# Patient Record
Sex: Female | Born: 1940 | Race: White | Hispanic: No | Marital: Married | Smoking: Never smoker
Health system: Southern US, Community
[De-identification: ages and names within clinical notes are randomized; demographics above are authoritative.]

## PROBLEM LIST (undated history)

## (undated) DIAGNOSIS — M519 Unspecified thoracic, thoracolumbar and lumbosacral intervertebral disc disorder: Secondary | ICD-10-CM

## (undated) DIAGNOSIS — N959 Unspecified menopausal and perimenopausal disorder: Secondary | ICD-10-CM

## (undated) DIAGNOSIS — G43909 Migraine, unspecified, not intractable, without status migrainosus: Secondary | ICD-10-CM

## (undated) DIAGNOSIS — I493 Ventricular premature depolarization: Secondary | ICD-10-CM

## (undated) DIAGNOSIS — E559 Vitamin D deficiency, unspecified: Secondary | ICD-10-CM

## (undated) DIAGNOSIS — Z974 Presence of external hearing-aid: Secondary | ICD-10-CM

## (undated) DIAGNOSIS — M792 Neuralgia and neuritis, unspecified: Secondary | ICD-10-CM

## (undated) DIAGNOSIS — Z8719 Personal history of other diseases of the digestive system: Secondary | ICD-10-CM

## (undated) DIAGNOSIS — R51 Headache: Secondary | ICD-10-CM

## (undated) DIAGNOSIS — F329 Major depressive disorder, single episode, unspecified: Secondary | ICD-10-CM

## (undated) DIAGNOSIS — G459 Transient cerebral ischemic attack, unspecified: Secondary | ICD-10-CM

## (undated) DIAGNOSIS — D649 Anemia, unspecified: Secondary | ICD-10-CM

## (undated) DIAGNOSIS — M542 Cervicalgia: Secondary | ICD-10-CM

## (undated) DIAGNOSIS — H9190 Unspecified hearing loss, unspecified ear: Secondary | ICD-10-CM

## (undated) DIAGNOSIS — E785 Hyperlipidemia, unspecified: Secondary | ICD-10-CM

## (undated) DIAGNOSIS — I10 Essential (primary) hypertension: Secondary | ICD-10-CM

## (undated) DIAGNOSIS — I499 Cardiac arrhythmia, unspecified: Secondary | ICD-10-CM

## (undated) DIAGNOSIS — F419 Anxiety disorder, unspecified: Secondary | ICD-10-CM

## (undated) DIAGNOSIS — F32A Depression, unspecified: Secondary | ICD-10-CM

## (undated) DIAGNOSIS — K219 Gastro-esophageal reflux disease without esophagitis: Secondary | ICD-10-CM

## (undated) DIAGNOSIS — L719 Rosacea, unspecified: Secondary | ICD-10-CM

## (undated) DIAGNOSIS — I639 Cerebral infarction, unspecified: Secondary | ICD-10-CM

## (undated) DIAGNOSIS — R519 Headache, unspecified: Secondary | ICD-10-CM

## (undated) HISTORY — PX: TONSILLECTOMY: SUR1361

## (undated) HISTORY — PX: BUNIONECTOMY: SHX129

## (undated) HISTORY — PX: ABDOMINAL HYSTERECTOMY: SHX81

## (undated) HISTORY — PX: CHOLECYSTECTOMY: SHX55

## (undated) HISTORY — PX: COLONOSCOPY, ESOPHAGOGASTRODUODENOSCOPY (EGD) AND ESOPHAGEAL DILATION: SHX5781

---

## 2004-09-18 ENCOUNTER — Ambulatory Visit: Payer: Self-pay | Admitting: Internal Medicine

## 2005-12-24 ENCOUNTER — Ambulatory Visit: Payer: Self-pay | Admitting: Internal Medicine

## 2006-01-13 ENCOUNTER — Ambulatory Visit: Payer: Self-pay | Admitting: Internal Medicine

## 2007-01-17 ENCOUNTER — Ambulatory Visit: Payer: Self-pay | Admitting: Internal Medicine

## 2007-09-09 ENCOUNTER — Ambulatory Visit: Payer: Self-pay | Admitting: Internal Medicine

## 2007-09-16 ENCOUNTER — Ambulatory Visit: Payer: Self-pay | Admitting: Surgery

## 2008-01-26 ENCOUNTER — Ambulatory Visit: Payer: Self-pay | Admitting: Internal Medicine

## 2009-02-06 ENCOUNTER — Ambulatory Visit: Payer: Self-pay | Admitting: Internal Medicine

## 2009-07-23 ENCOUNTER — Ambulatory Visit: Payer: Self-pay | Admitting: Internal Medicine

## 2010-02-11 ENCOUNTER — Ambulatory Visit: Payer: Self-pay | Admitting: Internal Medicine

## 2010-06-23 ENCOUNTER — Ambulatory Visit: Payer: Self-pay | Admitting: Unknown Physician Specialty

## 2010-06-25 LAB — PATHOLOGY REPORT

## 2010-08-20 ENCOUNTER — Ambulatory Visit: Payer: Self-pay | Admitting: Unknown Physician Specialty

## 2011-02-17 ENCOUNTER — Other Ambulatory Visit: Payer: Self-pay | Admitting: Specialist

## 2011-02-17 DIAGNOSIS — R51 Headache: Secondary | ICD-10-CM

## 2011-02-24 ENCOUNTER — Ambulatory Visit
Admission: RE | Admit: 2011-02-24 | Discharge: 2011-02-24 | Disposition: A | Discharge status: Home / Self Care | Payer: Medicare PPO | Source: Ambulatory Visit | Attending: Specialist | Admitting: Specialist

## 2011-02-24 DIAGNOSIS — R51 Headache: Secondary | ICD-10-CM

## 2011-03-05 ENCOUNTER — Ambulatory Visit: Payer: Self-pay | Admitting: Internal Medicine

## 2011-10-01 ENCOUNTER — Ambulatory Visit: Payer: Self-pay | Admitting: Unknown Physician Specialty

## 2012-03-14 ENCOUNTER — Ambulatory Visit: Payer: Self-pay | Admitting: Internal Medicine

## 2013-03-24 ENCOUNTER — Ambulatory Visit: Payer: Self-pay | Admitting: Internal Medicine

## 2014-01-19 ENCOUNTER — Ambulatory Visit: Payer: Self-pay | Admitting: Podiatry

## 2014-01-23 ENCOUNTER — Encounter: Payer: Self-pay | Admitting: Podiatry

## 2014-01-23 ENCOUNTER — Ambulatory Visit (INDEPENDENT_AMBULATORY_CARE_PROVIDER_SITE_OTHER): Payer: Medicare PPO | Admitting: Podiatry

## 2014-01-23 ENCOUNTER — Ambulatory Visit (INDEPENDENT_AMBULATORY_CARE_PROVIDER_SITE_OTHER): Payer: Medicare PPO

## 2014-01-23 VITALS — BP 114/70 | HR 67 | Resp 12

## 2014-01-23 DIAGNOSIS — L84 Corns and callosities: Secondary | ICD-10-CM

## 2014-01-23 DIAGNOSIS — R52 Pain, unspecified: Secondary | ICD-10-CM

## 2014-01-23 DIAGNOSIS — M2010 Hallux valgus (acquired), unspecified foot: Secondary | ICD-10-CM

## 2014-01-23 NOTE — Progress Notes (Signed)
   Subjective:    Patient ID: Brittany Phillips, female    DOB: [birth year], 73 y.o.   MRN: [id number]  HPI PT STATED RT FOOT START HURTING 2-3 MONTHS. THE FOOT IS BEEN THE SAME AND IS NOT GETTING WORSE. THE FOOT GET AGGRAVATED BY PUTTING PRESSURE AND WALKING, TRIED NO TREATMENT.    Review of Systems  HENT: Positive for hearing loss and sinus pressure.   Eyes: Positive for itching.  Genitourinary: Positive for frequency.  Allergic/Immunologic: Positive for food allergies.  Neurological: Positive for tremors and headaches.  Psychiatric/Behavioral: The patient is nervous/anxious.        Objective:   Physical Exam        Assessment & Plan:

## 2014-01-23 NOTE — Progress Notes (Signed)
Subjective:     Patient ID: Brittany Phillips, female   DOB: 1941-03-02, 73 y.o.   MRN: [id number]  Foot Pain   patient presents stating my right foot has started to bother me more for the last few months and I don't remember specifically what I have done. It does not her a lot but he gets aggravated   Review of Systems  All other systems reviewed and are negative.       Objective:   Physical Exam  Nursing note and vitals reviewed. Constitutional: She is oriented to person, place, and time.  Cardiovascular: Intact distal pulses.   Musculoskeletal: Normal range of motion.  Neurological: She is oriented to person, place, and time.  Skin: Skin is warm.   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

## 2014-06-01 DIAGNOSIS — K219 Gastro-esophageal reflux disease without esophagitis: Secondary | ICD-10-CM | POA: Insufficient documentation

## 2014-06-01 DIAGNOSIS — M519 Unspecified thoracic, thoracolumbar and lumbosacral intervertebral disc disorder: Secondary | ICD-10-CM | POA: Insufficient documentation

## 2014-06-01 DIAGNOSIS — I493 Ventricular premature depolarization: Secondary | ICD-10-CM | POA: Insufficient documentation

## 2014-07-04 ENCOUNTER — Ambulatory Visit: Payer: Self-pay | Admitting: Internal Medicine

## 2014-07-06 ENCOUNTER — Ambulatory Visit: Payer: Self-pay | Admitting: Internal Medicine

## 2015-06-24 ENCOUNTER — Other Ambulatory Visit: Payer: Self-pay | Admitting: Internal Medicine

## 2015-06-24 DIAGNOSIS — Z1231 Encounter for screening mammogram for malignant neoplasm of breast: Secondary | ICD-10-CM

## 2015-06-26 ENCOUNTER — Encounter: Payer: Self-pay | Admitting: *Deleted

## 2015-06-27 ENCOUNTER — Encounter
Admission: RE | Disposition: A | Discharge status: Home / Self Care | Payer: Self-pay | Source: Ambulatory Visit | Attending: Unknown Physician Specialty

## 2015-06-27 ENCOUNTER — Ambulatory Visit
Admission: RE | Admit: 2015-06-27 | Discharge: 2015-06-27 | Disposition: A | Discharge status: Home / Self Care | Payer: Medicare PPO | Source: Ambulatory Visit | Attending: Unknown Physician Specialty | Admitting: Unknown Physician Specialty

## 2015-06-27 ENCOUNTER — Encounter: Payer: Self-pay | Admitting: *Deleted

## 2015-06-27 ENCOUNTER — Ambulatory Visit: Payer: Medicare PPO | Admitting: Anesthesiology

## 2015-06-27 DIAGNOSIS — R1310 Dysphagia, unspecified: Secondary | ICD-10-CM | POA: Diagnosis not present

## 2015-06-27 DIAGNOSIS — M792 Neuralgia and neuritis, unspecified: Secondary | ICD-10-CM | POA: Diagnosis not present

## 2015-06-27 DIAGNOSIS — Z90710 Acquired absence of both cervix and uterus: Secondary | ICD-10-CM | POA: Insufficient documentation

## 2015-06-27 DIAGNOSIS — M519 Unspecified thoracic, thoracolumbar and lumbosacral intervertebral disc disorder: Secondary | ICD-10-CM | POA: Insufficient documentation

## 2015-06-27 DIAGNOSIS — Z800 Family history of malignant neoplasm of digestive organs: Secondary | ICD-10-CM | POA: Diagnosis present

## 2015-06-27 DIAGNOSIS — K640 First degree hemorrhoids: Secondary | ICD-10-CM | POA: Diagnosis not present

## 2015-06-27 DIAGNOSIS — Z1211 Encounter for screening for malignant neoplasm of colon: Secondary | ICD-10-CM | POA: Diagnosis not present

## 2015-06-27 DIAGNOSIS — E559 Vitamin D deficiency, unspecified: Secondary | ICD-10-CM | POA: Insufficient documentation

## 2015-06-27 DIAGNOSIS — Z7982 Long term (current) use of aspirin: Secondary | ICD-10-CM | POA: Diagnosis not present

## 2015-06-27 DIAGNOSIS — Z79899 Other long term (current) drug therapy: Secondary | ICD-10-CM | POA: Insufficient documentation

## 2015-06-27 DIAGNOSIS — K219 Gastro-esophageal reflux disease without esophagitis: Secondary | ICD-10-CM | POA: Diagnosis present

## 2015-06-27 DIAGNOSIS — E785 Hyperlipidemia, unspecified: Secondary | ICD-10-CM | POA: Diagnosis not present

## 2015-06-27 DIAGNOSIS — Z885 Allergy status to narcotic agent status: Secondary | ICD-10-CM | POA: Diagnosis not present

## 2015-06-27 DIAGNOSIS — Z9889 Other specified postprocedural states: Secondary | ICD-10-CM | POA: Diagnosis not present

## 2015-06-27 DIAGNOSIS — R109 Unspecified abdominal pain: Secondary | ICD-10-CM | POA: Diagnosis not present

## 2015-06-27 DIAGNOSIS — R12 Heartburn: Secondary | ICD-10-CM | POA: Insufficient documentation

## 2015-06-27 DIAGNOSIS — Z7951 Long term (current) use of inhaled steroids: Secondary | ICD-10-CM | POA: Diagnosis not present

## 2015-06-27 HISTORY — DX: Vitamin D deficiency, unspecified: E55.9

## 2015-06-27 HISTORY — DX: Headache, unspecified: R51.9

## 2015-06-27 HISTORY — DX: Unspecified thoracic, thoracolumbar and lumbosacral intervertebral disc disorder: M51.9

## 2015-06-27 HISTORY — DX: Neuralgia and neuritis, unspecified: M79.2

## 2015-06-27 HISTORY — PX: ESOPHAGOGASTRODUODENOSCOPY (EGD) WITH PROPOFOL: SHX5813

## 2015-06-27 HISTORY — PX: COLONOSCOPY WITH PROPOFOL: SHX5780

## 2015-06-27 HISTORY — DX: Hyperlipidemia, unspecified: E78.5

## 2015-06-27 HISTORY — DX: Gastro-esophageal reflux disease without esophagitis: K21.9

## 2015-06-27 HISTORY — PX: SAVORY DILATION: SHX5439

## 2015-06-27 HISTORY — DX: Unspecified menopausal and perimenopausal disorder: N95.9

## 2015-06-27 HISTORY — DX: Anemia, unspecified: D64.9

## 2015-06-27 HISTORY — DX: Headache: R51

## 2015-06-27 HISTORY — DX: Cardiac arrhythmia, unspecified: I49.9

## 2015-06-27 HISTORY — DX: Cervicalgia: M54.2

## 2015-06-27 SURGERY — COLONOSCOPY WITH PROPOFOL
Anesthesia: General

## 2015-06-27 MED ORDER — PROPOFOL INFUSION 10 MG/ML OPTIME
INTRAVENOUS | Status: DC | PRN
  Administered 2015-06-27: 120 ug/kg/min via INTRAVENOUS

## 2015-06-27 MED ORDER — SODIUM CHLORIDE 0.9 % IV SOLN: INTRAVENOUS | Status: DC

## 2015-06-27 MED ORDER — MIDAZOLAM HCL 2 MG/2ML IJ SOLN
INTRAMUSCULAR | Status: DC | PRN
  Administered 2015-06-27: 1 mg via INTRAVENOUS

## 2015-06-27 MED ORDER — SODIUM CHLORIDE 0.9 % IV SOLN
INTRAVENOUS | Status: DC
  Administered 2015-06-27: 11:00:00 via INTRAVENOUS

## 2015-06-27 MED ORDER — LIDOCAINE HCL (CARDIAC) 20 MG/ML IV SOLN
INTRAVENOUS | Status: DC | PRN
  Administered 2015-06-27: 40 mg via INTRAVENOUS

## 2015-06-27 MED ORDER — GLYCOPYRROLATE 0.2 MG/ML IJ SOLN
INTRAMUSCULAR | Status: DC | PRN
  Administered 2015-06-27: 0.1 mg via INTRAVENOUS

## 2015-06-27 MED ORDER — FENTANYL CITRATE (PF) 100 MCG/2ML IJ SOLN
INTRAMUSCULAR | Status: DC | PRN
  Administered 2015-06-27: 50 ug via INTRAVENOUS

## 2015-06-27 NOTE — Op Note (Signed)
[facility] Patient Name: Brittany Phillips Procedure Date: 06/27/2015 11:06 AM MRN: [id number] Account #: [id number] Date of Birth: 1941-03-24 Admit Type: Outpatient Age: 74 Room: [facility] ENDO ROOM 1 Gender: Female Note Status: Finalized Procedure:         Upper GI endoscopy Indications:       Dysphagia, Heartburn Providers:         Scot Jun, MD Referring MD:      Danella Penton, MD (Referring MD) Medicines:         Propofol per Anesthesia Complications:     No immediate complications. Procedure:         Pre-Anesthesia Assessment:                    - After reviewing the risks and benefits, the patient was                     deemed in satisfactory condition to undergo the procedure.                    After obtaining informed consent, the endoscope was passed                     under direct vision. Throughout the procedure, the                     patient's blood pressure, pulse, and oxygen saturations                     were monitored continuously. The Olympus GIF-160 endoscope                     (S#. [id number]) was introduced through the mouth, and                     advanced to the second part of duodenum. The upper GI                     endoscopy was accomplished without difficulty. The patient                     tolerated the procedure well. Findings:      The examined esophagus was normal. A guidewire was placed and the scope       was withdrawn. Dilation was performed with a Savary dilator with mild       resistance at 15 mm and 16 mm.      The entire examined stomach was normal.      The examined duodenum was normal. Impression:        - Normal esophagus. Dilated.                    - Normal stomach.                    - Normal examined duodenum.                    - No specimens collected. Recommendation:    - soft food for 3 days, eat slowly, chew well, take small                     bites Scot Jun, MD 06/27/2015  11:18:52 AM This report has been signed electronically. Number of Addenda: 0 Note Initiated On: 06/27/2015 11:06 AM  [facility]

## 2015-06-27 NOTE — H&P (Signed)
Primary Care Physician:  Danella Penton., MD Primary Gastroenterologist:  Dr. Mechele Collin  Pre-Procedure History & Physical: HPI:  Brittany Phillips is a 74 y.o. female is here for an colonoscopy.   Past Medical History  Diagnosis Date  . Cervicalgia   . GERD (gastroesophageal reflux disease)   . Neuralgia   . Hyperlipidemia   . Post menopausal problems   . Headache   . Anemia   . Lumbar disc disease   . Dysrhythmia   . Vitamin D deficiency     Past Surgical History  Procedure Laterality Date  . Tonsillectomy    . Abdominal hysterectomy    . Colonoscopy, esophagogastroduodenoscopy (egd) and esophageal dilation    . Bunionectomy      Prior to Admission medications   Medication Sig Start Date End Date Taking? Authorizing Provider  butalbital-acetaminophen-caffeine (FIORICET WITH CODEINE) 50-325-40-30 MG per capsule Take 1 capsule by mouth 2 (two) times daily.   Yes Historical Provider, MD  etodolac (LODINE) 400 MG tablet Take 400 mg by mouth 2 (two) times daily.   Yes Historical Provider, MD  pantoprazole (PROTONIX) 40 MG tablet Take 40 mg by mouth daily.   Yes Historical Provider, MD  propranolol (INDERAL) 40 MG tablet Take 40 mg by mouth 3 (three) times daily.   Yes Historical Provider, MD  sucralfate (CARAFATE) 1 G tablet Take 1 g by mouth 4 (four) times daily -  with meals and at bedtime.   Yes Historical Provider, MD  traMADol (ULTRAM) 50 MG tablet Take 50 mg by mouth every 6 (six) hours as needed for moderate pain (3x/day as needed).   Yes Historical Provider, MD  ALPRAZolam (NIRAVAM) 0.25 MG dissolvable tablet Take 0.25 mg by mouth at bedtime as needed for anxiety.    Historical Provider, MD  aspirin 81 MG tablet Take 81 mg by mouth daily.    Historical Provider, MD  cyclobenzaprine (FLEXERIL) 10 MG tablet Take 10 mg by mouth 3 (three) times daily as needed for muscle spasms.    Historical Provider, MD  estradiol (ESTRACE) 1 MG tablet Take 1 mg by mouth daily.    Historical  Provider, MD  fluticasone (VERAMYST) 27.5 MCG/SPRAY nasal spray Place 2 sprays into the nose daily.    Historical Provider, MD  omeprazole (PRILOSEC) 20 MG capsule Take 20 mg by mouth daily.    Historical Provider, MD  venlafaxine XR (EFFEXOR-XR) 37.5 MG 24 hr capsule Take 37.5 mg by mouth daily with breakfast.    Historical Provider, MD  VITAMIN D, CHOLECALCIFEROL, PO Take by mouth.    Historical Provider, MD    Allergies as of 05/16/2015 - Review Complete 01/23/2014  Allergen Reaction Noted  . Codeine  01/23/2014    History reviewed. No pertinent family history.  Social History   Social History  . Marital Status: Married    Spouse Name: N/A  . Number of Children: N/A  . Years of Education: N/A   Occupational History  . Not on file.   Social History Main Topics  . Smoking status: Never Smoker   . Smokeless tobacco: Never Used  . Alcohol Use: No  . Drug Use: No  . Sexual Activity: Not on file   Other Topics Concern  . Not on file   Social History Narrative    Review of Systems: See HPI, otherwise negative ROS  Physical Exam: BP 146/86 mmHg  Pulse 86  Temp(Src) 96.5 F (35.8 C) (Tympanic)  Resp 14  Ht  (1.575  m)  Wt 65.772 kg (145 lb)  BMI 26.51 kg/m2  SpO2 100% General:   Alert,  pleasant and cooperative in NAD Head:  Normocephalic and atraumatic. Neck:  Supple; no masses or thyromegaly. Lungs:  Clear throughout to auscultation.    Heart:  Regular rate and rhythm. Abdomen:  Soft, nontender and nondistended. Normal bowel sounds, without guarding, and without rebound.   Neurologic:  Alert and  oriented x4;  grossly normal neurologically.  Impression/Plan: Brittany Phillips is here for an colonoscopy to be performed for screening, family hx colon polyps  Risks, benefits, limitations, and alternatives regarding  colonoscopy have been reviewed with the patient.  Questions have been answered.  All parties agreeable.   Lynnae Prude, MD  06/27/2015, 1:11  PM  colonoscopy

## 2015-06-27 NOTE — Transfer of Care (Signed)
Immediate Anesthesia Transfer of Care Note  Patient: Brittany Phillips  Procedure(s) Performed: Procedure(s): COLONOSCOPY WITH PROPOFOL (N/A) ESOPHAGOGASTRODUODENOSCOPY (EGD) WITH PROPOFOL (N/A) SAVORY DILATION  Patient Location: PACU  Anesthesia Type:gen  Level of Consciousness: awake and sedated  Airway & Oxygen Therapy: Patient Spontanous Breathing and Patient connected to nasal cannula oxygen  Post-op Assessment: Report given to RN and Post -op Vital signs reviewed and stable  Post vital signs: Reviewed and stable  Last Vitals:  Filed Vitals:   06/27/15 1021  BP: 153/89  Pulse: 86  Temp: 36.6 C  Resp: 20    Complications: No apparent anesthesia complications

## 2015-06-27 NOTE — Op Note (Signed)
[facility] Patient Name: Brittany Phillips Procedure Date: 06/27/2015 11:05 AM MRN: [id number] Account #: [id number] Date of Birth: 1941-09-23 Admit Type: Outpatient Age: 74 Room: [facility] ENDO ROOM 1 Gender: Female Note Status: Finalized Procedure:         Colonoscopy Indications:       Screening in patient at increased risk: Family history of                     1st-degree relative with colorectal cancer Providers:         Scot Jun, MD Referring MD:      Danella Penton, MD (Referring MD) Medicines:         Propofol per Anesthesia Complications:     No immediate complications. Procedure:         Pre-Anesthesia Assessment:                    - After reviewing the risks and benefits, the patient was                     deemed in satisfactory condition to undergo the procedure.                    After obtaining informed consent, the colonoscope was                     passed under direct vision. Throughout the procedure, the                     patient's blood pressure, pulse, and oxygen saturations                     were monitored continuously. The Colonoscope was                     introduced through the anus and advanced to the the cecum,                     identified by appendiceal orifice and ileocecal valve. The                     colonoscopy was performed without difficulty. The patient                     tolerated the procedure well. The quality of the bowel                     preparation was excellent. Findings:      Internal hemorrhoids were found during endoscopy. The hemorrhoids were       small and Grade I (internal hemorrhoids that do not prolapse).      The exam was otherwise without abnormality. Impression:        - Internal hemorrhoids.                    - The examination was otherwise normal.                    - No specimens collected. Recommendation:    - Repeat colonoscopy in 5 years for screening purposes. Scot Jun, MD 06/27/2015 11:36:26 AM This report has been signed electronically. Number of Addenda: 0 Note Initiated On: 06/27/2015 11:05 AM Scope Withdrawal Time: 0 hours 6 minutes 1 second  Total Procedure Duration: 0 hours  13 minutes 3 seconds       [facility]

## 2015-06-27 NOTE — Anesthesia Postprocedure Evaluation (Signed)
  Anesthesia Post-op Note  Patient: Brittany Phillips  Procedure(s) Performed: Procedure(s): COLONOSCOPY WITH PROPOFOL (N/A) ESOPHAGOGASTRODUODENOSCOPY (EGD) WITH PROPOFOL (N/A) SAVORY DILATION  Anesthesia type:General  Patient location: PACU  Post pain: Pain level controlled  Post assessment: Post-op Vital signs reviewed, Patient's Cardiovascular Status Stable, Respiratory Function Stable, Patent Airway and No signs of Nausea or vomiting  Post vital signs: Reviewed and stable  Last Vitals:  Filed Vitals:   06/27/15 1210  BP: 146/86  Pulse: 86  Temp:   Resp: 14    Level of consciousness: awake, alert  and patient cooperative  Complications: No apparent anesthesia complications

## 2015-06-27 NOTE — Anesthesia Procedure Notes (Signed)
Performed by: COOK-MARTIN, Narcissa Melder Pre-anesthesia Checklist: Patient identified, Emergency Drugs available, Suction available, Patient being monitored and Timeout performed Patient Re-evaluated:Patient Re-evaluated prior to inductionOxygen Delivery Method: Nasal cannula Preoxygenation: Pre-oxygenation with 100% oxygen Intubation Type: IV induction Airway Equipment and Method: Bite block Placement Confirmation: CO2 detector and positive ETCO2     

## 2015-06-27 NOTE — H&P (Signed)
Primary Care Physician:  Danella Penton., MD Primary Gastroenterologist:  Dr. Mechele Collin  Pre-Procedure History & Physical: HPI:  Brittany Phillips is a 74 y.o. female is here for an endoscopy and colonoscopy.   Past Medical History  Diagnosis Date  . Cervicalgia   . GERD (gastroesophageal reflux disease)   . Neuralgia   . Hyperlipidemia   . Post menopausal problems   . Headache   . Anemia   . Lumbar disc disease   . Dysrhythmia   . Vitamin D deficiency     Past Surgical History  Procedure Laterality Date  . Tonsillectomy    . Abdominal hysterectomy    . Colonoscopy, esophagogastroduodenoscopy (egd) and esophageal dilation    . Bunionectomy      Prior to Admission medications   Medication Sig Start Date End Date Taking? Authorizing Provider  butalbital-acetaminophen-caffeine (FIORICET WITH CODEINE) 50-325-40-30 MG per capsule Take 1 capsule by mouth 2 (two) times daily.   Yes Historical Provider, MD  etodolac (LODINE) 400 MG tablet Take 400 mg by mouth 2 (two) times daily.   Yes Historical Provider, MD  pantoprazole (PROTONIX) 40 MG tablet Take 40 mg by mouth daily.   Yes Historical Provider, MD  propranolol (INDERAL) 40 MG tablet Take 40 mg by mouth 3 (three) times daily.   Yes Historical Provider, MD  sucralfate (CARAFATE) 1 G tablet Take 1 g by mouth 4 (four) times daily -  with meals and at bedtime.   Yes Historical Provider, MD  traMADol (ULTRAM) 50 MG tablet Take 50 mg by mouth every 6 (six) hours as needed for moderate pain (3x/day as needed).   Yes Historical Provider, MD  ALPRAZolam (NIRAVAM) 0.25 MG dissolvable tablet Take 0.25 mg by mouth at bedtime as needed for anxiety.    Historical Provider, MD  aspirin 81 MG tablet Take 81 mg by mouth daily.    Historical Provider, MD  cyclobenzaprine (FLEXERIL) 10 MG tablet Take 10 mg by mouth 3 (three) times daily as needed for muscle spasms.    Historical Provider, MD  estradiol (ESTRACE) 1 MG tablet Take 1 mg by mouth daily.     Historical Provider, MD  fluticasone (VERAMYST) 27.5 MCG/SPRAY nasal spray Place 2 sprays into the nose daily.    Historical Provider, MD  omeprazole (PRILOSEC) 20 MG capsule Take 20 mg by mouth daily.    Historical Provider, MD  venlafaxine XR (EFFEXOR-XR) 37.5 MG 24 hr capsule Take 37.5 mg by mouth daily with breakfast.    Historical Provider, MD  VITAMIN D, CHOLECALCIFEROL, PO Take by mouth.    Historical Provider, MD    Allergies as of 05/16/2015 - Review Complete 01/23/2014  Allergen Reaction Noted  . Codeine  01/23/2014    History reviewed. No pertinent family history.  Social History   Social History  . Marital Status: Married    Spouse Name: N/A  . Number of Children: N/A  . Years of Education: N/A   Occupational History  . Not on file.   Social History Main Topics  . Smoking status: Never Smoker   . Smokeless tobacco: Never Used  . Alcohol Use: No  . Drug Use: No  . Sexual Activity: Not on file   Other Topics Concern  . Not on file   Social History Narrative    Review of Systems: See HPI, otherwise negative ROS  Physical Exam: BP 153/89 mmHg  Pulse 86  Temp(Src) 97.8 F (36.6 C) (Tympanic)  Resp 20  Ht 5'  2" (1.575 m)  Wt 65.772 kg (145 lb)  BMI 26.51 kg/m2  SpO2 100% General:   Alert,  pleasant and cooperative in NAD Head:  Normocephalic and atraumatic. Neck:  Supple; no masses or thyromegaly. Lungs:  Clear throughout to auscultation.    Heart:  Regular rate and rhythm. Abdomen:  Soft, nontender and nondistended. Normal bowel sounds, without guarding, and without rebound.   Neurologic:  Alert and  oriented x4;  grossly normal neurologically.  Impression/Plan: Brittany Phillips is here for an endoscopy and colonoscopy to be performed for Family history of colon cancer and abdominal pain and heartburn and dysphagia.  Risks, benefits, limitations, and alternatives regarding  endoscopy and colonoscopy have been reviewed with the patient.  Questions  have been answered.  All parties agreeable.   Lynnae Prude, MD  06/27/2015, 11:04 AM

## 2015-06-27 NOTE — Anesthesia Preprocedure Evaluation (Signed)
Anesthesia Evaluation  Patient identified by MRN, date of birth, ID band Patient awake    Reviewed: Allergy & Precautions, NPO status , Patient's Chart, lab work & pertinent test results, reviewed documented beta blocker date and time   Airway Mallampati: II  TM Distance: >3 FB     Dental  (+) Chipped   Pulmonary          Cardiovascular + dysrhythmias     Neuro/Psych  Headaches,  Neuromuscular disease    GI/Hepatic GERD-  ,  Endo/Other    Renal/GU      Musculoskeletal   Abdominal   Peds  Hematology  (+) anemia ,   Anesthesia Other Findings   Reproductive/Obstetrics                             Anesthesia Physical Anesthesia Plan  ASA: III  Anesthesia Plan: General   Post-op Pain Management:    Induction:   Airway Management Planned: Nasal Cannula  Additional Equipment:   Intra-op Plan:   Post-operative Plan:   Informed Consent: I have reviewed the patients History and Physical, chart, labs and discussed the procedure including the risks, benefits and alternatives for the proposed anesthesia with the patient or authorized representative who has indicated his/her understanding and acceptance.     Plan Discussed with: CRNA  Anesthesia Plan Comments:         Anesthesia Quick Evaluation

## 2015-06-28 ENCOUNTER — Encounter: Payer: Self-pay | Admitting: Unknown Physician Specialty

## 2015-07-08 ENCOUNTER — Ambulatory Visit
Admission: RE | Admit: 2015-07-08 | Discharge: 2015-07-08 | Disposition: A | Discharge status: Home / Self Care | Payer: Medicare PPO | Source: Ambulatory Visit | Attending: Internal Medicine | Admitting: Internal Medicine

## 2015-07-08 ENCOUNTER — Other Ambulatory Visit: Payer: Self-pay | Admitting: Internal Medicine

## 2015-07-08 DIAGNOSIS — Z1231 Encounter for screening mammogram for malignant neoplasm of breast: Secondary | ICD-10-CM

## 2015-11-17 DIAGNOSIS — G459 Transient cerebral ischemic attack, unspecified: Secondary | ICD-10-CM

## 2015-11-17 HISTORY — DX: Transient cerebral ischemic attack, unspecified: G45.9

## 2016-01-24 ENCOUNTER — Emergency Department
Admission: EM | Admit: 2016-01-24 | Discharge: 2016-01-24 | Disposition: A | Discharge status: Home / Self Care | Payer: Medicare PPO | Attending: Student | Admitting: Student

## 2016-01-24 ENCOUNTER — Emergency Department: Payer: Medicare PPO

## 2016-01-24 ENCOUNTER — Encounter: Payer: Self-pay | Admitting: Emergency Medicine

## 2016-01-24 DIAGNOSIS — R531 Weakness: Secondary | ICD-10-CM | POA: Diagnosis present

## 2016-01-24 DIAGNOSIS — R270 Ataxia, unspecified: Secondary | ICD-10-CM | POA: Diagnosis not present

## 2016-01-24 DIAGNOSIS — Z7982 Long term (current) use of aspirin: Secondary | ICD-10-CM | POA: Insufficient documentation

## 2016-01-24 DIAGNOSIS — Z79899 Other long term (current) drug therapy: Secondary | ICD-10-CM | POA: Insufficient documentation

## 2016-01-24 HISTORY — DX: Migraine, unspecified, not intractable, without status migrainosus: G43.909

## 2016-01-24 HISTORY — DX: Ventricular premature depolarization: I49.3

## 2016-01-24 LAB — CBC
HCT: 42.7 % (ref 35.0–47.0)
Hemoglobin: 14.7 g/dL (ref 12.0–16.0)
MCH: 32.5 pg (ref 26.0–34.0)
MCHC: 34.6 g/dL (ref 32.0–36.0)
MCV: 94.1 fL (ref 80.0–100.0)
PLATELETS: 229 10*3/uL (ref 150–440)
RBC: 4.53 MIL/uL (ref 3.80–5.20)
RDW: 13.9 % (ref 11.5–14.5)
WBC: 6.6 10*3/uL (ref 3.6–11.0)

## 2016-01-24 LAB — URINALYSIS COMPLETE WITH MICROSCOPIC (ARMC ONLY)
Bacteria, UA: NONE SEEN
Bilirubin Urine: NEGATIVE
GLUCOSE, UA: NEGATIVE mg/dL
Hgb urine dipstick: NEGATIVE
KETONES UR: NEGATIVE mg/dL
Leukocytes, UA: NEGATIVE
NITRITE: NEGATIVE
Protein, ur: NEGATIVE mg/dL
SPECIFIC GRAVITY, URINE: 1.011 (ref 1.005–1.030)
pH: 6 (ref 5.0–8.0)

## 2016-01-24 LAB — COMPREHENSIVE METABOLIC PANEL
ALK PHOS: 56 U/L (ref 38–126)
ALT: 16 U/L (ref 14–54)
ANION GAP: 6 (ref 5–15)
AST: 16 U/L (ref 15–41)
Albumin: 3.5 g/dL (ref 3.5–5.0)
BUN: 16 mg/dL (ref 6–20)
CALCIUM: 9.2 mg/dL (ref 8.9–10.3)
CO2: 27 mmol/L (ref 22–32)
Chloride: 106 mmol/L (ref 101–111)
Creatinine, Ser: 0.7 mg/dL (ref 0.44–1.00)
GFR calc non Af Amer: >60 mL/min (ref >60)
Glucose, Bld: 101 mg/dL — ABNORMAL HIGH (ref 65–99)
POTASSIUM: 4.1 mmol/L (ref 3.5–5.1)
SODIUM: 139 mmol/L (ref 135–145)
Total Bilirubin: 0.5 mg/dL (ref 0.3–1.2)
Total Protein: 6.3 g/dL — ABNORMAL LOW (ref 6.5–8.1)

## 2016-01-24 LAB — DIFFERENTIAL
BASOS PCT: 1 %
Basophils Absolute: 0.1 10*3/uL (ref 0–0.1)
EOS ABS: 0.1 10*3/uL (ref 0–0.7)
EOS PCT: 1 %
LYMPHS PCT: 27 %
Lymphs Abs: 1.7 10*3/uL (ref 1.0–3.6)
MONO ABS: 0.3 10*3/uL (ref 0.2–0.9)
Monocytes Relative: 5 %
Neutro Abs: 4.3 10*3/uL (ref 1.4–6.5)
Neutrophils Relative %: 66 %

## 2016-01-24 LAB — TROPONIN I: Troponin I: <0.03 ng/mL (ref <0.031)

## 2016-01-24 LAB — PROTIME-INR
INR: 1.03
PROTHROMBIN TIME: 13.7 s (ref 11.4–15.0)

## 2016-01-24 LAB — APTT: aPTT: 30 seconds (ref 24–36)

## 2016-01-24 MED ORDER — SODIUM CHLORIDE 0.9 % IV BOLUS (SEPSIS)
500.0000 mL | Freq: Once | INTRAVENOUS | Status: AC
  Administered 2016-01-24: 500 mL via INTRAVENOUS

## 2016-01-24 NOTE — ED Notes (Signed)
MD at bedside. 

## 2016-01-24 NOTE — Discharge Instructions (Signed)
Return immediately to the emergency department if you develop any recurrence of weakness, any development of numbness, speech or word finding difficulties, severe headache, vision change, chest pain, difficulty breathing, fever, abdominal pain, recurrent vomiting, blood in vomit or stool or for any other concerns. Follow up with your primary care doctor as soon as possible regarding today's visit.

## 2016-01-24 NOTE — ED Provider Notes (Signed)
[facility] Emergency Department Provider Note  ____________________________________________  Time seen: Approximately 4:35 PM  I have reviewed the triage vital signs and the nursing notes.   HISTORY  Chief Complaint Weakness    HPI Brittany Phillips is a 75 y.o. female history of migraines, hyperlipidemia, GERD who presents for evaluation of generalized weakness today which began at approximately 12 PM, sudden onset, initially severe, now moderate, no modifying factors. The patient reports that she woke this morning in her usual state of health however at approximately noon she felt weak in her legs and as if "my legs weren't doing what I wanted them to do". She also felt weak and had a fall however her husband caught her, she did not injure herself. She denies any laterality to her symptoms, no speech difficulties. She denies any chest pain or difficulty breathing. No recent illness including no cough, sneezing, runny nose, congestion, vomiting, diarrhea, fevers or chills. No dysuria. History of CVA.   Past Medical History  Diagnosis Date  . Cervicalgia   . GERD (gastroesophageal reflux disease)   . Neuralgia   . Hyperlipidemia   . Post menopausal problems   . Headache   . Anemia   . Lumbar disc disease   . Dysrhythmia   . Vitamin D deficiency   . Migraines   . Hyperlipidemia   . PVC's (premature ventricular contractions)     There are no active problems to display for this patient.   Past Surgical History  Procedure Laterality Date  . Tonsillectomy    . Abdominal hysterectomy    . Colonoscopy, esophagogastroduodenoscopy (egd) and esophageal dilation    . Bunionectomy    . Colonoscopy with propofol N/A 06/27/2015    Procedure: COLONOSCOPY WITH PROPOFOL;  Surgeon: Scot Junobert T Elliott, MD;  [facility];  Service: Endoscopy;  Laterality: N/A;  . Esophagogastroduodenoscopy (egd) with propofol N/A 06/27/2015    Procedure:  ESOPHAGOGASTRODUODENOSCOPY (EGD) WITH PROPOFOL;  Surgeon: Scot Junobert T Elliott, MD;  [facility];  Service: Endoscopy;  Laterality: N/A;  . Savory dilation  06/27/2015    Procedure: SAVORY DILATION;  Surgeon: Scot Junobert T Elliott, MD;  [facility];  Service: Endoscopy;;    Current Outpatient Rx  Name  Route  Sig  Dispense  Refill  . ALPRAZolam (XANAX) 0.25 MG tablet   Oral   Take 0.25 mg by mouth 2 (two) times daily.         Marland Kitchen. aspirin EC 81 MG tablet   Oral   Take 81 mg by mouth at bedtime.         Marland Kitchen. aspirin-sod bicarb-citric acid (ALKA-SELTZER) 325 MG TBEF tablet   Oral   Take 650 mg by mouth every 6 (six) hours as needed (for indigestion).         . butalbital-acetaminophen-caffeine (FIORICET, ESGIC) 50-325-40 MG tablet   Oral   Take 1 tablet by mouth 2 (two) times daily as needed for migraine.         . cholecalciferol (VITAMIN D) 1000 units tablet   Oral   Take 2,000 Units by mouth daily.         . cyclobenzaprine (FLEXERIL) 5 MG tablet   Oral   Take 5 mg by mouth at bedtime.         Marland Kitchen. estradiol (ESTRACE) 1 MG tablet   Oral   Take 1 mg by mouth daily.         Marland Kitchen. etodolac (LODINE) 400 MG tablet  Oral   Take 400 mg by mouth 2 (two) times daily as needed for mild pain.          . fluticasone (VERAMYST) 27.5 MCG/SPRAY nasal spray   Each Nare   Place 2 sprays into both nostrils at bedtime.          Marland Kitchen omeprazole (PRILOSEC) 20 MG capsule   Oral   Take 20 mg by mouth 2 (two) times daily before a meal.          . propranolol (INDERAL) 40 MG tablet   Oral   Take 40 mg by mouth daily.          Marland Kitchen venlafaxine (EFFEXOR) 37.5 MG tablet   Oral   Take 18.75 mg by mouth daily.           Allergies Codeine  No family history on file.  Social History Social History  Substance Use Topics  . Smoking status: Never Smoker   . Smokeless tobacco: Never Used  . Alcohol Use: No    Review of Systems Constitutional: No  fever/chills Eyes: No visual changes. ENT: No sore throat. Cardiovascular: Denies chest pain. Respiratory: Denies shortness of breath. Gastrointestinal: No abdominal pain.  No nausea, no vomiting.  No diarrhea.  No constipation. Genitourinary: Negative for dysuria. Musculoskeletal: Negative for back pain. Skin: Negative for rash. Neurological: Negative for headaches, focal weakness or numbness.  10-point ROS otherwise negative.  ____________________________________________   PHYSICAL EXAM:  VITAL SIGNS: ED Triage Vitals  Enc Vitals Group     BP 01/24/16 1537 142/76 mmHg     Pulse Rate 01/24/16 1537 66     Resp 01/24/16 1537 18     Temp 01/24/16 1537 98.1 F (36.7 C)     Temp Source 01/24/16 1537 Oral     SpO2 01/24/16 1537 96 %     Weight 01/24/16 1537 150 lb (68.04 kg)     Height 01/24/16 1537  (1.575 m)     Head Cir --      Peak Flow --      Pain Score 01/24/16 1538 0     Pain Loc --      Pain Edu? --      Excl. in GC? --     Constitutional: Alert and oriented. Well appearing and in no acute distress. Eyes: Conjunctivae are normal. PERRL. EOMI. Head: Atraumatic. Nose: No congestion/rhinnorhea. Mouth/Throat: Mucous membranes are moist.  Oropharynx non-erythematous. Neck: No stridor.  Supple without meningismus. Cardiovascular: Normal rate, regular rhythm. Grossly normal heart sounds.  Good peripheral circulation. Respiratory: Normal respiratory effort.  No retractions. Lungs CTAB. Gastrointestinal: Soft and nontender. No distention.  No CVA tenderness. Genitourinary: deferred Musculoskeletal: No lower extremity tenderness nor edema.  No joint effusions. Neurologic:  Normal speech and language. No gross focal neurologic deficits are appreciated. 5 out of 5 strength in bilateral upper and lower extremities, sensation intact to light touch throughout, cranial nerves II through XII intact, normal finger-nose-finger and heel to shin. Skin:  Skin is warm, dry and  intact. No rash noted. Psychiatric: Mood and affect are normal. Speech and behavior are normal.  ____________________________________________   LABS (all labs ordered are listed, but only abnormal results are displayed)  Labs Reviewed  COMPREHENSIVE METABOLIC PANEL - Abnormal; Notable for the following:    Glucose, Bld 101 (*)    Total Protein 6.3 (*)    All other components within normal limits  URINALYSIS COMPLETEWITH MICROSCOPIC ([facility] ONLY) - Abnormal; Notable for the  following:    Color, Urine YELLOW (*)    APPearance CLEAR (*)    Squamous Epithelial / LPF 0-5 (*)    All other components within normal limits  PROTIME-INR  APTT  CBC  DIFFERENTIAL  TROPONIN I  CBG MONITORING, ED   ____________________________________________  EKG  ED ECG REPORT I, Gayla Doss, the attending physician, personally viewed and interpreted this ECG.   Date: 01/24/2016  EKG Time: 15:41  Rate: 68  Rhythm: normal sinus rhythm  Axis: normal  Intervals:none  ST&T Change: No acute ST elevation. Q waves in lead 3, V1, V2. Non-specific T wave abnormality in V3, V4, V5.  ____________________________________________  RADIOLOGY  CXR  IMPRESSION: No active cardiopulmonary disease.   CT head IMPRESSION: No evidence of acute intracranial abnormality. ____________________________________________   PROCEDURES  Procedure(s) performed: None  Critical Care performed: No  ____________________________________________   INITIAL IMPRESSION / ASSESSMENT AND PLAN / ED COURSE  Pertinent labs & imaging results that were available during my care of the patient were reviewed by me and considered in my medical decision making (see chart for details).  Brittany Phillips is a 75 y.o. female history of migraines, hyperlipidemia, GERD who presents for evaluation of generalized weakness today which began at approximately 12 PM. On exam, she is gently well-appearing and in no acute distress. Vital  signs stable, she is afebrile. Code stroke was initiated on the patient's arrival however she has been assessed by me as well as the neurological specialist on call, Dr. Alisa Graff, and given that she has an intact neurological exam, NIH stroke scale is 0, we both doubt that this represents acute CVA or TIA. She is not receiving TPA given that she has no neurological deficits. Per discussion with neurologist who saw her, Dr. Alisa Graff, suspect that this may be secondary to some component of orthostasis, mild transient cerebral hypoperfusion given symptoms began upon standing. Plan for screening labs, chest x-ray, urinalysis and reassess for disposition.  ----------------------------------------- 7:27 PM on 01/24/2016 ----------------------------------------- Labs reviewed. CBC, CMP, coags, urinalysis, chest x-ray and CT head are negative for any acute abnormality and are generally unremarkable. Patient reports that she feels much better at this time. She received a small IV fluid bolus. She is ambulatory without any assistance. Discussed with her that the exact cause of her symptoms tonight is not clear and she needs to follow up with her primary care doctor as soon as possible. We also discussed meticulous return precautions and she is comfortable with the discharge plan. DC home.  ____________________________________________   FINAL CLINICAL IMPRESSION(S) / ED DIAGNOSES  Final diagnoses:  Weakness      Gayla Doss, MD 01/24/16 1928

## 2016-01-24 NOTE — ED Notes (Signed)
Code  Stroke  Called  To  [phone] 

## 2016-01-24 NOTE — ED Notes (Signed)
Patient had head trauma in 10/2015. Patient states since then she has had tingling to left side of face prior to today. At approx lunch time today, she developed tingling to both sides of face. States she felt fine this am, but then developed difficulty with legs at approx lunch time. Patient denies having CT after head trauma in December. States she saw Dr. Hyacinth MeekerMiller at that time d/t pinched nerves in neck, but did not have CT. Patient also reports headaches since time of head trauma, area of headache varies.

## 2016-01-24 NOTE — ED Notes (Signed)
SOC started. 

## 2016-01-24 NOTE — ED Notes (Signed)
Patient returned from XR. 

## 2016-01-24 NOTE — ED Notes (Signed)
Patient transported to X-ray 

## 2016-01-24 NOTE — ED Notes (Addendum)
Change CC from CVA to Weakness per MD order

## 2016-01-27 ENCOUNTER — Ambulatory Visit
Admission: RE | Admit: 2016-01-27 | Discharge: 2016-01-27 | Disposition: A | Discharge status: Home / Self Care | Payer: Medicare PPO | Source: Ambulatory Visit | Attending: Internal Medicine | Admitting: Internal Medicine

## 2016-01-27 ENCOUNTER — Other Ambulatory Visit: Payer: Self-pay | Admitting: Internal Medicine

## 2016-01-27 DIAGNOSIS — I6523 Occlusion and stenosis of bilateral carotid arteries: Secondary | ICD-10-CM | POA: Diagnosis not present

## 2016-01-27 DIAGNOSIS — I6330 Cerebral infarction due to thrombosis of unspecified cerebral artery: Secondary | ICD-10-CM | POA: Diagnosis not present

## 2016-02-03 DIAGNOSIS — E782 Mixed hyperlipidemia: Secondary | ICD-10-CM | POA: Insufficient documentation

## 2016-02-03 DIAGNOSIS — G451 Carotid artery syndrome (hemispheric): Secondary | ICD-10-CM | POA: Insufficient documentation

## 2016-06-30 ENCOUNTER — Other Ambulatory Visit: Payer: Self-pay | Admitting: Internal Medicine

## 2016-06-30 DIAGNOSIS — Z1231 Encounter for screening mammogram for malignant neoplasm of breast: Secondary | ICD-10-CM

## 2016-07-10 ENCOUNTER — Ambulatory Visit: Payer: Medicare PPO

## 2016-07-22 ENCOUNTER — Ambulatory Visit: Payer: Medicare PPO

## 2016-07-28 ENCOUNTER — Ambulatory Visit
Admission: RE | Admit: 2016-07-28 | Discharge: 2016-07-28 | Disposition: A | Discharge status: Home / Self Care | Payer: Medicare PPO | Source: Ambulatory Visit | Attending: Internal Medicine | Admitting: Internal Medicine

## 2016-07-28 ENCOUNTER — Other Ambulatory Visit: Payer: Self-pay | Admitting: Internal Medicine

## 2016-07-28 DIAGNOSIS — Z1231 Encounter for screening mammogram for malignant neoplasm of breast: Secondary | ICD-10-CM

## 2016-12-14 DIAGNOSIS — Z0000 Encounter for general adult medical examination without abnormal findings: Secondary | ICD-10-CM | POA: Insufficient documentation

## 2016-12-14 DIAGNOSIS — R7989 Other specified abnormal findings of blood chemistry: Secondary | ICD-10-CM | POA: Insufficient documentation

## 2016-12-14 DIAGNOSIS — I10 Essential (primary) hypertension: Secondary | ICD-10-CM | POA: Insufficient documentation

## 2017-06-25 DIAGNOSIS — E538 Deficiency of other specified B group vitamins: Secondary | ICD-10-CM | POA: Insufficient documentation

## 2017-06-25 DIAGNOSIS — E039 Hypothyroidism, unspecified: Secondary | ICD-10-CM | POA: Insufficient documentation

## 2017-07-20 ENCOUNTER — Other Ambulatory Visit: Payer: Self-pay | Admitting: Internal Medicine

## 2017-07-20 DIAGNOSIS — Z1231 Encounter for screening mammogram for malignant neoplasm of breast: Secondary | ICD-10-CM

## 2017-08-03 ENCOUNTER — Ambulatory Visit
Admission: RE | Admit: 2017-08-03 | Discharge: 2017-08-03 | Disposition: A | Discharge status: Home / Self Care | Payer: Medicare PPO | Source: Ambulatory Visit | Attending: Internal Medicine | Admitting: Internal Medicine

## 2017-08-03 DIAGNOSIS — Z1231 Encounter for screening mammogram for malignant neoplasm of breast: Secondary | ICD-10-CM

## 2017-09-03 ENCOUNTER — Other Ambulatory Visit: Payer: Self-pay | Admitting: Internal Medicine

## 2017-09-03 DIAGNOSIS — R1011 Right upper quadrant pain: Secondary | ICD-10-CM

## 2017-09-06 ENCOUNTER — Ambulatory Visit
Admission: RE | Admit: 2017-09-06 | Discharge: 2017-09-06 | Disposition: A | Discharge status: Home / Self Care | Payer: Medicare PPO | Source: Ambulatory Visit | Attending: Internal Medicine | Admitting: Internal Medicine

## 2017-09-06 DIAGNOSIS — R1011 Right upper quadrant pain: Secondary | ICD-10-CM | POA: Insufficient documentation

## 2017-09-06 DIAGNOSIS — Z9049 Acquired absence of other specified parts of digestive tract: Secondary | ICD-10-CM | POA: Diagnosis not present

## 2018-02-16 NOTE — Discharge Instructions (Signed)

## 2018-02-22 ENCOUNTER — Ambulatory Visit: Payer: Medicare PPO | Admitting: Anesthesiology

## 2018-02-22 ENCOUNTER — Encounter
Admission: RE | Disposition: A | Discharge status: Home / Self Care | Payer: Self-pay | Source: Ambulatory Visit | Attending: Ophthalmology

## 2018-02-22 ENCOUNTER — Ambulatory Visit
Admission: RE | Admit: 2018-02-22 | Discharge: 2018-02-22 | Disposition: A | Discharge status: Home / Self Care | Payer: Medicare PPO | Source: Ambulatory Visit | Attending: Ophthalmology | Admitting: Ophthalmology

## 2018-02-22 DIAGNOSIS — Z7982 Long term (current) use of aspirin: Secondary | ICD-10-CM | POA: Insufficient documentation

## 2018-02-22 DIAGNOSIS — F419 Anxiety disorder, unspecified: Secondary | ICD-10-CM | POA: Insufficient documentation

## 2018-02-22 DIAGNOSIS — Z8673 Personal history of transient ischemic attack (TIA), and cerebral infarction without residual deficits: Secondary | ICD-10-CM | POA: Insufficient documentation

## 2018-02-22 DIAGNOSIS — F329 Major depressive disorder, single episode, unspecified: Secondary | ICD-10-CM | POA: Insufficient documentation

## 2018-02-22 DIAGNOSIS — Z79899 Other long term (current) drug therapy: Secondary | ICD-10-CM | POA: Diagnosis not present

## 2018-02-22 DIAGNOSIS — H2511 Age-related nuclear cataract, right eye: Secondary | ICD-10-CM | POA: Insufficient documentation

## 2018-02-22 DIAGNOSIS — K219 Gastro-esophageal reflux disease without esophagitis: Secondary | ICD-10-CM | POA: Insufficient documentation

## 2018-02-22 DIAGNOSIS — Z79890 Hormone replacement therapy (postmenopausal): Secondary | ICD-10-CM | POA: Insufficient documentation

## 2018-02-22 DIAGNOSIS — I10 Essential (primary) hypertension: Secondary | ICD-10-CM | POA: Diagnosis not present

## 2018-02-22 HISTORY — DX: Transient cerebral ischemic attack, unspecified: G45.9

## 2018-02-22 HISTORY — PX: CATARACT EXTRACTION W/PHACO: SHX586

## 2018-02-22 HISTORY — DX: Rosacea, unspecified: L71.9

## 2018-02-22 SURGERY — PHACOEMULSIFICATION, CATARACT, WITH IOL INSERTION
Anesthesia: Monitor Anesthesia Care | Laterality: Right | Wound class: "Clean "

## 2018-02-22 MED ORDER — BSS IO SOLN
INTRAOCULAR | Status: DC | PRN
  Administered 2018-02-22: 72 mL via OPHTHALMIC

## 2018-02-22 MED ORDER — LIDOCAINE HCL (PF) 2 % IJ SOLN
INTRAOCULAR | Status: DC | PRN
  Administered 2018-02-22: 1 mL via INTRAOCULAR

## 2018-02-22 MED ORDER — MOXIFLOXACIN HCL 0.5 % OP SOLN
OPHTHALMIC | Status: DC | PRN
  Administered 2018-02-22: 0.2 mL via OPHTHALMIC

## 2018-02-22 MED ORDER — SODIUM HYALURONATE 10 MG/ML IO SOLN
INTRAOCULAR | Status: DC | PRN
  Administered 2018-02-22: 0.55 mL via INTRAOCULAR

## 2018-02-22 MED ORDER — MIDAZOLAM HCL 2 MG/2ML IJ SOLN
INTRAMUSCULAR | Status: DC | PRN
  Administered 2018-02-22: 2 mg via INTRAVENOUS

## 2018-02-22 MED ORDER — FENTANYL CITRATE (PF) 100 MCG/2ML IJ SOLN
INTRAMUSCULAR | Status: DC | PRN
  Administered 2018-02-22: 50 ug via INTRAVENOUS

## 2018-02-22 MED ORDER — ARMC OPHTHALMIC DILATING DROPS
1.0000 "application " | OPHTHALMIC | Status: DC | PRN
  Administered 2018-02-22 (×3): 1 via OPHTHALMIC

## 2018-02-22 MED ORDER — SODIUM HYALURONATE 23 MG/ML IO SOLN
INTRAOCULAR | Status: DC | PRN
  Administered 2018-02-22: 0.6 mL via INTRAOCULAR

## 2018-02-22 SURGICAL SUPPLY — 17 items
CANNULA ANT/CHMB 27G (MISCELLANEOUS) ×1 IMPLANT
CANNULA ANT/CHMB 27GA (MISCELLANEOUS) ×3 IMPLANT
DISSECTOR HYDRO NUCLEUS 50X22 (MISCELLANEOUS) ×3 IMPLANT
GLOVE BIO SURGEON STRL SZ8 (GLOVE) ×3 IMPLANT
GLOVE SURG LX 7.5 STRW (GLOVE) ×2
GLOVE SURG LX STRL 7.5 STRW (GLOVE) ×1 IMPLANT
GOWN STRL REUS W/ TWL LRG LVL3 (GOWN DISPOSABLE) ×2 IMPLANT
GOWN STRL REUS W/TWL LRG LVL3 (GOWN DISPOSABLE) ×4
LENS IOL TECNIS ITEC 20.5 (Intraocular Lens) ×2 IMPLANT
MARKER SKIN DUAL TIP RULER LAB (MISCELLANEOUS) ×3 IMPLANT
PACK CATARACT (MISCELLANEOUS) ×3 IMPLANT
PACK DR. KING ARMS (PACKS) ×3 IMPLANT
PACK EYE AFTER SURG (MISCELLANEOUS) ×3 IMPLANT
SYR 3ML LL SCALE MARK (SYRINGE) ×3 IMPLANT
SYR TB 1ML LUER SLIP (SYRINGE) ×3 IMPLANT
WATER STERILE IRR 500ML POUR (IV SOLUTION) ×3 IMPLANT
WIPE NON LINTING 3.25X3.25 (MISCELLANEOUS) ×3 IMPLANT

## 2018-02-22 NOTE — Anesthesia Procedure Notes (Signed)
Procedure Name: MAC Date/Time: 02/22/2018 10:00 AM Performed by: Janna Arch, CRNA Pre-anesthesia Checklist: Patient identified, Emergency Drugs available, Suction available and Patient being monitored Patient Re-evaluated:Patient Re-evaluated prior to induction Oxygen Delivery Method: Nasal cannula

## 2018-02-22 NOTE — H&P (Signed)
The History and Physical notes are on paper, have been signed, and are to be scanned.   I have examined the patient and there are no changes to the H&P.   Willey BladeBradley Adarryl Goldammer 02/22/2018 9:56 AM

## 2018-02-22 NOTE — Transfer of Care (Signed)
Immediate Anesthesia Transfer of Care Note  Patient: Brittany Phillips  Procedure(s) Performed: CATARACT EXTRACTION PHACO AND INTRAOCULAR LENS PLACEMENT (IOC) RIGHT (Right )  Patient Location: PACU  Anesthesia Type: MAC  Level of Consciousness: awake, alert  and patient cooperative  Airway and Oxygen Therapy: Patient Spontanous Breathing and Patient connected to supplemental oxygen  Post-op Assessment: Post-op Vital signs reviewed, Patient's Cardiovascular Status Stable, Respiratory Function Stable, Patent Airway and No signs of Nausea or vomiting  Post-op Vital Signs: Reviewed and stable  Complications: No apparent anesthesia complications

## 2018-02-22 NOTE — Anesthesia Postprocedure Evaluation (Signed)
Anesthesia Post Note  Patient: Brittany Phillips  Procedure(s) Performed: CATARACT EXTRACTION PHACO AND INTRAOCULAR LENS PLACEMENT (IOC) RIGHT (Right )  Patient location during evaluation: PACU Anesthesia Type: MAC Level of consciousness: awake and alert Pain management: pain level controlled Vital Signs Assessment: post-procedure vital signs reviewed and stable Respiratory status: spontaneous breathing, nonlabored ventilation, respiratory function stable and patient connected to nasal cannula oxygen Cardiovascular status: blood pressure returned to baseline and stable Postop Assessment: no apparent nausea or vomiting Anesthetic complications: no    Tynesha Free ELAINE

## 2018-02-22 NOTE — Anesthesia Preprocedure Evaluation (Signed)
Anesthesia Evaluation  Patient identified by MRN, date of birth, ID band Patient awake    Reviewed: Allergy & Precautions, H&P , NPO status , Patient's Chart, lab work & pertinent test results, reviewed documented beta blocker date and time   Airway Mallampati: II  TM Distance: >3 FB Neck ROM: full    Dental no notable dental hx.    Pulmonary neg pulmonary ROS,    Pulmonary exam normal breath sounds clear to auscultation       Cardiovascular Exercise Tolerance: Good negative cardio ROS   Rhythm:regular Rate:Normal     Neuro/Psych  Headaches, TIAnegative psych ROS   GI/Hepatic Neg liver ROS, GERD  ,  Endo/Other  negative endocrine ROS  Renal/GU negative Renal ROS  negative genitourinary   Musculoskeletal   Abdominal   Peds  Hematology  (+) anemia ,   Anesthesia Other Findings   Reproductive/Obstetrics negative OB ROS                             Anesthesia Physical Anesthesia Plan  ASA: II  Anesthesia Plan: MAC   Post-op Pain Management:    Induction:   PONV Risk Score and Plan:   Airway Management Planned:   Additional Equipment:   Intra-op Plan:   Post-operative Plan:   Informed Consent: I have reviewed the patients History and Physical, chart, labs and discussed the procedure including the risks, benefits and alternatives for the proposed anesthesia with the patient or authorized representative who has indicated his/her understanding and acceptance.   Dental Advisory Given  Plan Discussed with: CRNA  Anesthesia Plan Comments:         Anesthesia Quick Evaluation

## 2018-02-22 NOTE — Op Note (Signed)
OPERATIVE NOTE  Brittany Phillips [id number] 02/22/2018   PREOPERATIVE DIAGNOSIS:  Nuclear sclerotic cataract right eye.  H25.11   POSTOPERATIVE DIAGNOSIS:    Nuclear sclerotic cataract right eye.     PROCEDURE:  Phacoemusification with posterior chamber intraocular lens placement of the right eye   LENS:   Implant Name Type Inv. Item Serial No. Manufacturer Lot No. LRB No. Used  LENS IOL DIOP 20.5 - [id number] Intraocular Lens LENS IOL DIOP 20.5 [id number]  Right 1       PCB00 +20.5   ULTRASOUND TIME: 0 minutes 31.9 seconds.  CDE 4.93   SURGEON:  Willey BladeBradley Tiffany Calmes, MD, MPH  ANESTHESIOLOGIST: Anesthesiologist: Aldine ContesScouras, Nicole Elaine, MD CRNA: Maryan RuedWilson, Jennifer M, CRNA   ANESTHESIA:  Topical with tetracaine drops augmented with 1% preservative-free intracameral lidocaine.  ESTIMATED BLOOD LOSS: less than 1 mL.   COMPLICATIONS:  None.   DESCRIPTION OF PROCEDURE:  The patient was identified in the holding room and transported to the operating room and placed in the supine position under the operating microscope.  The right eye was identified as the operative eye and it was prepped and draped in the usual sterile ophthalmic fashion.   A 1.0 millimeter clear-corneal paracentesis was made at the 10:30 position. 0.5 ml of preservative-free 1% lidocaine with epinephrine was injected into the anterior chamber.  The anterior chamber was filled with Healon 5 viscoelastic.  A 2.4 millimeter keratome was used to make a near-clear corneal incision at the 8:00 position.  A curvilinear capsulorrhexis was made with a cystotome and capsulorrhexis forceps.  Balanced salt solution was used to hydrodissect and hydrodelineate the nucleus.   Phacoemulsification was then used in stop and chop fashion to remove the lens nucleus and epinucleus.  The remaining cortex was then removed using the irrigation and aspiration handpiece. Healon was then placed into the capsular bag to distend it for lens placement.  A  lens was then injected into the capsular bag.  The remaining viscoelastic was aspirated.   Wounds were hydrated with balanced salt solution.  The anterior chamber was inflated to a physiologic pressure with balanced salt solution.   Intracameral vigamox 0.1 mL undiluted was injected into the eye and a drop placed onto the ocular surface.  No wound leaks were noted.  The patient was taken to the recovery room in stable condition without complications of anesthesia or surgery  Willey BladeBradley [name] 02/22/2018, 10:20 AM

## 2018-02-23 ENCOUNTER — Encounter: Payer: Self-pay | Admitting: Ophthalmology

## 2018-03-25 ENCOUNTER — Other Ambulatory Visit: Payer: Self-pay

## 2018-03-25 NOTE — Discharge Instructions (Signed)

## 2018-03-29 ENCOUNTER — Encounter
Admission: RE | Disposition: A | Discharge status: Home / Self Care | Payer: Self-pay | Source: Ambulatory Visit | Attending: Ophthalmology

## 2018-03-29 ENCOUNTER — Ambulatory Visit: Payer: Medicare PPO | Admitting: Anesthesiology

## 2018-03-29 ENCOUNTER — Ambulatory Visit
Admission: RE | Admit: 2018-03-29 | Discharge: 2018-03-29 | Disposition: A | Discharge status: Home / Self Care | Payer: Medicare PPO | Source: Ambulatory Visit | Attending: Ophthalmology | Admitting: Ophthalmology

## 2018-03-29 DIAGNOSIS — Z8673 Personal history of transient ischemic attack (TIA), and cerebral infarction without residual deficits: Secondary | ICD-10-CM | POA: Insufficient documentation

## 2018-03-29 DIAGNOSIS — Z885 Allergy status to narcotic agent status: Secondary | ICD-10-CM | POA: Diagnosis not present

## 2018-03-29 DIAGNOSIS — H2512 Age-related nuclear cataract, left eye: Secondary | ICD-10-CM | POA: Diagnosis present

## 2018-03-29 DIAGNOSIS — Z9049 Acquired absence of other specified parts of digestive tract: Secondary | ICD-10-CM | POA: Diagnosis not present

## 2018-03-29 DIAGNOSIS — D649 Anemia, unspecified: Secondary | ICD-10-CM | POA: Insufficient documentation

## 2018-03-29 DIAGNOSIS — E039 Hypothyroidism, unspecified: Secondary | ICD-10-CM | POA: Diagnosis not present

## 2018-03-29 DIAGNOSIS — F329 Major depressive disorder, single episode, unspecified: Secondary | ICD-10-CM | POA: Insufficient documentation

## 2018-03-29 DIAGNOSIS — I10 Essential (primary) hypertension: Secondary | ICD-10-CM | POA: Diagnosis not present

## 2018-03-29 DIAGNOSIS — F419 Anxiety disorder, unspecified: Secondary | ICD-10-CM | POA: Diagnosis not present

## 2018-03-29 DIAGNOSIS — I493 Ventricular premature depolarization: Secondary | ICD-10-CM | POA: Diagnosis not present

## 2018-03-29 DIAGNOSIS — L719 Rosacea, unspecified: Secondary | ICD-10-CM | POA: Insufficient documentation

## 2018-03-29 DIAGNOSIS — R51 Headache: Secondary | ICD-10-CM | POA: Insufficient documentation

## 2018-03-29 DIAGNOSIS — H9193 Unspecified hearing loss, bilateral: Secondary | ICD-10-CM | POA: Diagnosis not present

## 2018-03-29 DIAGNOSIS — I499 Cardiac arrhythmia, unspecified: Secondary | ICD-10-CM | POA: Insufficient documentation

## 2018-03-29 DIAGNOSIS — Z90710 Acquired absence of both cervix and uterus: Secondary | ICD-10-CM | POA: Insufficient documentation

## 2018-03-29 DIAGNOSIS — Z9841 Cataract extraction status, right eye: Secondary | ICD-10-CM | POA: Insufficient documentation

## 2018-03-29 DIAGNOSIS — K449 Diaphragmatic hernia without obstruction or gangrene: Secondary | ICD-10-CM | POA: Insufficient documentation

## 2018-03-29 DIAGNOSIS — K219 Gastro-esophageal reflux disease without esophagitis: Secondary | ICD-10-CM | POA: Insufficient documentation

## 2018-03-29 DIAGNOSIS — Z888 Allergy status to other drugs, medicaments and biological substances status: Secondary | ICD-10-CM | POA: Insufficient documentation

## 2018-03-29 HISTORY — DX: Personal history of other diseases of the digestive system: Z87.19

## 2018-03-29 HISTORY — DX: Major depressive disorder, single episode, unspecified: F32.9

## 2018-03-29 HISTORY — PX: CATARACT EXTRACTION W/PHACO: SHX586

## 2018-03-29 HISTORY — DX: Depression, unspecified: F32.A

## 2018-03-29 HISTORY — DX: Unspecified hearing loss, unspecified ear: H91.90

## 2018-03-29 HISTORY — DX: Essential (primary) hypertension: I10

## 2018-03-29 HISTORY — DX: Anxiety disorder, unspecified: F41.9

## 2018-03-29 SURGERY — PHACOEMULSIFICATION, CATARACT, WITH IOL INSERTION
Anesthesia: Monitor Anesthesia Care | Site: Eye | Laterality: Left | Wound class: "Clean "

## 2018-03-29 MED ORDER — SODIUM HYALURONATE 10 MG/ML IO SOLN
INTRAOCULAR | Status: DC | PRN
  Administered 2018-03-29: 0.55 mL via INTRAOCULAR

## 2018-03-29 MED ORDER — MIDAZOLAM HCL 2 MG/2ML IJ SOLN
INTRAMUSCULAR | Status: DC | PRN
  Administered 2018-03-29: 1.5 mg via INTRAVENOUS

## 2018-03-29 MED ORDER — SODIUM HYALURONATE 23 MG/ML IO SOLN
INTRAOCULAR | Status: DC | PRN
  Administered 2018-03-29: 0.6 mL via INTRAOCULAR

## 2018-03-29 MED ORDER — LACTATED RINGERS IV SOLN: INTRAVENOUS | Status: DC

## 2018-03-29 MED ORDER — BALANCED SALT IO SOLN
INTRAOCULAR | Status: DC | PRN
  Administered 2018-03-29: 1.5 mL via INTRAOCULAR

## 2018-03-29 MED ORDER — EPINEPHRINE PF 1 MG/ML IJ SOLN
INTRAOCULAR | Status: DC | PRN
  Administered 2018-03-29: 70 mL via OPHTHALMIC

## 2018-03-29 MED ORDER — MOXIFLOXACIN HCL 0.5 % OP SOLN
OPHTHALMIC | Status: DC | PRN
  Administered 2018-03-29: 0.2 mL via OPHTHALMIC

## 2018-03-29 MED ORDER — FENTANYL CITRATE (PF) 100 MCG/2ML IJ SOLN
INTRAMUSCULAR | Status: DC | PRN
  Administered 2018-03-29: 50 ug via INTRAVENOUS

## 2018-03-29 MED ORDER — ARMC OPHTHALMIC DILATING DROPS
1.0000 "application " | OPHTHALMIC | Status: DC | PRN
  Administered 2018-03-29 (×3): 1 via OPHTHALMIC

## 2018-03-29 MED ORDER — ACETAMINOPHEN 160 MG/5ML PO SOLN: 325.0000 mg | ORAL | Status: DC | PRN

## 2018-03-29 MED ORDER — ONDANSETRON HCL 4 MG/2ML IJ SOLN: 4.0000 mg | Freq: Once | INTRAMUSCULAR | Status: DC | PRN

## 2018-03-29 MED ORDER — ACETAMINOPHEN 325 MG PO TABS: 650.0000 mg | ORAL_TABLET | Freq: Once | ORAL | Status: DC | PRN

## 2018-03-29 SURGICAL SUPPLY — 17 items

## 2018-03-29 NOTE — Anesthesia Procedure Notes (Signed)
Procedure Name: MAC Performed by: Lakrista Scaduto, CRNA Pre-anesthesia Checklist: Patient identified, Emergency Drugs available, Suction available, Timeout performed and Patient being monitored Patient Re-evaluated:Patient Re-evaluated prior to induction Oxygen Delivery Method: Nasal cannula Placement Confirmation: positive ETCO2       

## 2018-03-29 NOTE — Transfer of Care (Signed)
Immediate Anesthesia Transfer of Care Note  Patient: Brittany Phillips  Procedure(s) Performed: CATARACT EXTRACTION PHACO AND INTRAOCULAR LENS PLACEMENT (IOC) LEFT IVA TOPICAL (Left Eye)  Patient Location: PACU  Anesthesia Type: MAC  Level of Consciousness: awake, alert  and patient cooperative  Airway and Oxygen Therapy: Patient Spontanous Breathing and Patient connected to supplemental oxygen  Post-op Assessment: Post-op Vital signs reviewed, Patient's Cardiovascular Status Stable, Respiratory Function Stable, Patent Airway and No signs of Nausea or vomiting  Post-op Vital Signs: Reviewed and stable  Complications: No apparent anesthesia complications

## 2018-03-29 NOTE — Anesthesia Postprocedure Evaluation (Signed)
Anesthesia Post Note  Patient: Brittany Phillips  Procedure(s) Performed: CATARACT EXTRACTION PHACO AND INTRAOCULAR LENS PLACEMENT (IOC) LEFT IVA TOPICAL (Left Eye)  Patient location during evaluation: PACU Anesthesia Type: MAC Level of consciousness: awake and alert, oriented and patient cooperative Pain management: pain level controlled Vital Signs Assessment: post-procedure vital signs reviewed and stable Respiratory status: spontaneous breathing, nonlabored ventilation and respiratory function stable Cardiovascular status: blood pressure returned to baseline and stable Postop Assessment: adequate PO intake Anesthetic complications: no    Darrin Nipper

## 2018-03-29 NOTE — H&P (Addendum)
The History and Physical notes are on paper, have been signed, and are to be scanned.   I have examined the patient and there are no changes to the H&P.   Willey Blade 03/29/2018 9:52 AM

## 2018-03-29 NOTE — Anesthesia Preprocedure Evaluation (Signed)
Anesthesia Evaluation  Patient identified by MRN, date of birth, ID band Patient awake    Reviewed: Allergy & Precautions, NPO status , Patient's Chart, lab work & pertinent test results  History of Anesthesia Complications Negative for: history of anesthetic complications  Airway Mallampati: II  TM Distance: >3 FB Neck ROM: Full    Dental  (+)    Pulmonary neg pulmonary ROS,    Pulmonary exam normal breath sounds clear to auscultation       Cardiovascular Exercise Tolerance: Good hypertension, Normal cardiovascular exam Rhythm:Regular Rate:Normal  PVCs   Neuro/Psych  Headaches, PSYCHIATRIC DISORDERS Anxiety Depression TIA (01/2016)   GI/Hepatic GERD  ,  Endo/Other  Hypothyroidism   Renal/GU negative Renal ROS     Musculoskeletal   Abdominal   Peds  Hematology  (+) Blood dyscrasia, anemia ,   Anesthesia Other Findings   Reproductive/Obstetrics                             Anesthesia Physical Anesthesia Plan  ASA: II  Anesthesia Plan: MAC   Post-op Pain Management:    Induction: Intravenous  PONV Risk Score and Plan: 2 and TIVA and Midazolam  Airway Management Planned: Natural Airway  Additional Equipment:   Intra-op Plan:   Post-operative Plan:   Informed Consent: I have reviewed the patients History and Physical, chart, labs and discussed the procedure including the risks, benefits and alternatives for the proposed anesthesia with the patient or authorized representative who has indicated his/her understanding and acceptance.     Plan Discussed with: CRNA  Anesthesia Plan Comments:         Anesthesia Quick Evaluation

## 2018-03-29 NOTE — Op Note (Signed)
OPERATIVE NOTE  Brittany Phillips [id number] 03/29/2018   PREOPERATIVE DIAGNOSIS:  Nuclear sclerotic cataract left eye.  H25.12   POSTOPERATIVE DIAGNOSIS:    Nuclear sclerotic cataract left eye.     PROCEDURE:  Phacoemusification with posterior chamber intraocular lens placement of the left eye   LENS:   Implant Name Type Inv. Item Serial No. Manufacturer Lot No. LRB No. Used  LENS IOL DIOP 21.5 - [id number] Intraocular Lens LENS IOL DIOP 21.5 [id number]  Left 1       PCB00 +21.5   ULTRASOUND TIME: 0 minutes 30 seconds.  CDE 4.42   SURGEON:  Willey Blade, MD, MPH   ANESTHESIA:  Topical with tetracaine drops augmented with 1% preservative-free intracameral lidocaine.  ESTIMATED BLOOD LOSS: <1 mL   COMPLICATIONS:  None.   DESCRIPTION OF PROCEDURE:  The patient was identified in the holding room and transported to the operating room and placed in the supine position under the operating microscope.  The left eye was identified as the operative eye and it was prepped and draped in the usual sterile ophthalmic fashion.   A 1.0 millimeter clear-corneal paracentesis was made at the 5:00 position. 0.5 ml of preservative-free 1% lidocaine with epinephrine was injected into the anterior chamber.  The anterior chamber was filled with Healon 5 viscoelastic.  A 2.4 millimeter keratome was used to make a near-clear corneal incision at the 2:00 position.  A curvilinear capsulorrhexis was made with a cystotome and capsulorrhexis forceps.  Balanced salt solution was used to hydrodissect and hydrodelineate the nucleus.   Phacoemulsification was then used in stop and chop fashion to remove the lens nucleus and epinucleus.  The remaining cortex was then removed using the irrigation and aspiration handpiece. Healon was then placed into the capsular bag to distend it for lens placement.  A lens was then injected into the capsular bag.  The remaining viscoelastic was aspirated.   Wounds were hydrated  with balanced salt solution.  The anterior chamber was inflated to a physiologic pressure with balanced salt solution.  Intracameral vigamox 0.1 mL undiltued was injected into the eye and a drop placed onto the ocular surface.  No wound leaks were noted.  The patient was taken to the recovery room in stable condition without complications of anesthesia or surgery  Willey Blade 03/29/2018, 10:20 AM

## 2018-03-31 ENCOUNTER — Encounter: Payer: Self-pay | Admitting: Ophthalmology

## 2018-08-30 ENCOUNTER — Other Ambulatory Visit: Payer: Self-pay | Admitting: Internal Medicine

## 2018-08-30 DIAGNOSIS — Z1231 Encounter for screening mammogram for malignant neoplasm of breast: Secondary | ICD-10-CM

## 2018-09-22 ENCOUNTER — Ambulatory Visit
Admission: RE | Admit: 2018-09-22 | Discharge: 2018-09-22 | Disposition: A | Discharge status: Home / Self Care | Payer: Medicare PPO | Source: Ambulatory Visit | Attending: Internal Medicine | Admitting: Internal Medicine

## 2018-09-22 DIAGNOSIS — Z1231 Encounter for screening mammogram for malignant neoplasm of breast: Secondary | ICD-10-CM | POA: Insufficient documentation

## 2019-01-20 DIAGNOSIS — H903 Sensorineural hearing loss, bilateral: Secondary | ICD-10-CM | POA: Diagnosis not present

## 2019-01-27 DIAGNOSIS — H903 Sensorineural hearing loss, bilateral: Secondary | ICD-10-CM | POA: Diagnosis not present

## 2019-03-13 DIAGNOSIS — L237 Allergic contact dermatitis due to plants, except food: Secondary | ICD-10-CM | POA: Diagnosis not present

## 2019-03-13 DIAGNOSIS — K210 Gastro-esophageal reflux disease with esophagitis: Secondary | ICD-10-CM | POA: Diagnosis not present

## 2019-03-13 DIAGNOSIS — E538 Deficiency of other specified B group vitamins: Secondary | ICD-10-CM | POA: Diagnosis not present

## 2019-04-06 DIAGNOSIS — J0100 Acute maxillary sinusitis, unspecified: Secondary | ICD-10-CM | POA: Diagnosis not present

## 2019-04-19 DIAGNOSIS — H04123 Dry eye syndrome of bilateral lacrimal glands: Secondary | ICD-10-CM | POA: Diagnosis not present

## 2019-04-27 DIAGNOSIS — M47812 Spondylosis without myelopathy or radiculopathy, cervical region: Secondary | ICD-10-CM | POA: Diagnosis not present

## 2019-04-27 DIAGNOSIS — M542 Cervicalgia: Secondary | ICD-10-CM | POA: Diagnosis not present

## 2019-04-27 DIAGNOSIS — M47813 Spondylosis without myelopathy or radiculopathy, cervicothoracic region: Secondary | ICD-10-CM | POA: Diagnosis not present

## 2019-05-05 DIAGNOSIS — M5412 Radiculopathy, cervical region: Secondary | ICD-10-CM | POA: Diagnosis not present

## 2019-05-15 DIAGNOSIS — M62838 Other muscle spasm: Secondary | ICD-10-CM | POA: Diagnosis not present

## 2019-05-15 DIAGNOSIS — M5412 Radiculopathy, cervical region: Secondary | ICD-10-CM | POA: Diagnosis not present

## 2019-05-15 DIAGNOSIS — M5030 Other cervical disc degeneration, unspecified cervical region: Secondary | ICD-10-CM | POA: Diagnosis not present

## 2019-05-29 DIAGNOSIS — M542 Cervicalgia: Secondary | ICD-10-CM | POA: Diagnosis not present

## 2019-05-29 DIAGNOSIS — M5030 Other cervical disc degeneration, unspecified cervical region: Secondary | ICD-10-CM | POA: Diagnosis not present

## 2019-05-29 DIAGNOSIS — M436 Torticollis: Secondary | ICD-10-CM | POA: Diagnosis not present

## 2019-05-29 DIAGNOSIS — M6281 Muscle weakness (generalized): Secondary | ICD-10-CM | POA: Diagnosis not present

## 2019-06-01 DIAGNOSIS — M5030 Other cervical disc degeneration, unspecified cervical region: Secondary | ICD-10-CM | POA: Diagnosis not present

## 2019-06-05 DIAGNOSIS — M5030 Other cervical disc degeneration, unspecified cervical region: Secondary | ICD-10-CM | POA: Diagnosis not present

## 2019-06-07 DIAGNOSIS — M5030 Other cervical disc degeneration, unspecified cervical region: Secondary | ICD-10-CM | POA: Diagnosis not present

## 2019-06-13 DIAGNOSIS — M5030 Other cervical disc degeneration, unspecified cervical region: Secondary | ICD-10-CM | POA: Diagnosis not present

## 2019-06-22 DIAGNOSIS — M5030 Other cervical disc degeneration, unspecified cervical region: Secondary | ICD-10-CM | POA: Diagnosis not present

## 2019-06-26 DIAGNOSIS — M5412 Radiculopathy, cervical region: Secondary | ICD-10-CM | POA: Diagnosis not present

## 2019-06-26 DIAGNOSIS — M5030 Other cervical disc degeneration, unspecified cervical region: Secondary | ICD-10-CM | POA: Diagnosis not present

## 2019-06-29 DIAGNOSIS — M5412 Radiculopathy, cervical region: Secondary | ICD-10-CM | POA: Diagnosis not present

## 2019-06-29 DIAGNOSIS — E538 Deficiency of other specified B group vitamins: Secondary | ICD-10-CM | POA: Diagnosis not present

## 2019-06-29 DIAGNOSIS — E782 Mixed hyperlipidemia: Secondary | ICD-10-CM | POA: Diagnosis not present

## 2019-06-29 DIAGNOSIS — M542 Cervicalgia: Secondary | ICD-10-CM | POA: Diagnosis not present

## 2019-06-30 DIAGNOSIS — M5412 Radiculopathy, cervical region: Secondary | ICD-10-CM | POA: Diagnosis not present

## 2019-06-30 DIAGNOSIS — M5030 Other cervical disc degeneration, unspecified cervical region: Secondary | ICD-10-CM | POA: Diagnosis not present

## 2019-07-07 ENCOUNTER — Other Ambulatory Visit: Payer: Self-pay | Admitting: Physical Medicine and Rehabilitation

## 2019-07-07 ENCOUNTER — Ambulatory Visit: Payer: PPO

## 2019-07-07 DIAGNOSIS — Z0000 Encounter for general adult medical examination without abnormal findings: Secondary | ICD-10-CM | POA: Diagnosis not present

## 2019-07-07 DIAGNOSIS — E782 Mixed hyperlipidemia: Secondary | ICD-10-CM | POA: Diagnosis not present

## 2019-07-07 DIAGNOSIS — E538 Deficiency of other specified B group vitamins: Secondary | ICD-10-CM | POA: Diagnosis not present

## 2019-07-07 DIAGNOSIS — M5412 Radiculopathy, cervical region: Secondary | ICD-10-CM

## 2019-07-07 DIAGNOSIS — M5010 Cervical disc disorder with radiculopathy, unspecified cervical region: Secondary | ICD-10-CM | POA: Diagnosis not present

## 2019-07-10 ENCOUNTER — Ambulatory Visit
Admission: RE | Admit: 2019-07-10 | Discharge: 2019-07-10 | Disposition: A | Discharge status: Home / Self Care | Payer: PPO | Source: Ambulatory Visit | Attending: Physical Medicine and Rehabilitation | Admitting: Physical Medicine and Rehabilitation

## 2019-07-10 ENCOUNTER — Other Ambulatory Visit: Payer: Self-pay

## 2019-07-10 DIAGNOSIS — M50322 Other cervical disc degeneration at C5-C6 level: Secondary | ICD-10-CM | POA: Diagnosis not present

## 2019-07-10 DIAGNOSIS — M5412 Radiculopathy, cervical region: Secondary | ICD-10-CM | POA: Diagnosis not present

## 2019-07-18 DIAGNOSIS — M5412 Radiculopathy, cervical region: Secondary | ICD-10-CM | POA: Diagnosis not present

## 2019-07-18 DIAGNOSIS — M5030 Other cervical disc degeneration, unspecified cervical region: Secondary | ICD-10-CM | POA: Diagnosis not present

## 2019-09-01 DIAGNOSIS — M5116 Intervertebral disc disorders with radiculopathy, lumbar region: Secondary | ICD-10-CM | POA: Diagnosis not present

## 2019-09-28 DIAGNOSIS — R600 Localized edema: Secondary | ICD-10-CM | POA: Diagnosis not present

## 2019-09-28 DIAGNOSIS — I10 Essential (primary) hypertension: Secondary | ICD-10-CM | POA: Diagnosis not present

## 2019-10-27 DIAGNOSIS — R233 Spontaneous ecchymoses: Secondary | ICD-10-CM | POA: Diagnosis not present

## 2019-10-27 DIAGNOSIS — M5116 Intervertebral disc disorders with radiculopathy, lumbar region: Secondary | ICD-10-CM | POA: Diagnosis not present

## 2019-10-27 DIAGNOSIS — M542 Cervicalgia: Secondary | ICD-10-CM | POA: Diagnosis not present

## 2019-12-13 ENCOUNTER — Other Ambulatory Visit: Payer: Self-pay | Admitting: Student

## 2019-12-13 DIAGNOSIS — R6881 Early satiety: Secondary | ICD-10-CM

## 2019-12-13 DIAGNOSIS — R1310 Dysphagia, unspecified: Secondary | ICD-10-CM

## 2019-12-13 DIAGNOSIS — R1319 Other dysphagia: Secondary | ICD-10-CM

## 2019-12-13 DIAGNOSIS — K219 Gastro-esophageal reflux disease without esophagitis: Secondary | ICD-10-CM

## 2019-12-19 ENCOUNTER — Other Ambulatory Visit: Payer: Self-pay

## 2019-12-19 ENCOUNTER — Ambulatory Visit
Admission: RE | Admit: 2019-12-19 | Discharge: 2019-12-19 | Disposition: A | Discharge status: Home / Self Care | Payer: Medicare HMO | Source: Ambulatory Visit | Attending: Student | Admitting: Student

## 2019-12-19 DIAGNOSIS — R1310 Dysphagia, unspecified: Secondary | ICD-10-CM

## 2019-12-19 DIAGNOSIS — R1319 Other dysphagia: Secondary | ICD-10-CM

## 2019-12-19 DIAGNOSIS — R6881 Early satiety: Secondary | ICD-10-CM | POA: Diagnosis present

## 2019-12-19 DIAGNOSIS — K219 Gastro-esophageal reflux disease without esophagitis: Secondary | ICD-10-CM | POA: Diagnosis present

## 2020-01-14 ENCOUNTER — Encounter (HOSPITAL_COMMUNITY): Payer: Self-pay | Admitting: Emergency Medicine

## 2020-01-14 ENCOUNTER — Other Ambulatory Visit: Payer: Self-pay

## 2020-01-14 ENCOUNTER — Emergency Department (HOSPITAL_COMMUNITY): Payer: Medicare HMO

## 2020-01-14 ENCOUNTER — Emergency Department (HOSPITAL_COMMUNITY)
Admission: EM | Admit: 2020-01-14 | Discharge: 2020-01-14 | Disposition: A | Discharge status: Home / Self Care | Payer: Medicare HMO | Attending: Emergency Medicine | Admitting: Emergency Medicine

## 2020-01-14 DIAGNOSIS — Z7982 Long term (current) use of aspirin: Secondary | ICD-10-CM | POA: Insufficient documentation

## 2020-01-14 DIAGNOSIS — Y939 Activity, unspecified: Secondary | ICD-10-CM | POA: Diagnosis not present

## 2020-01-14 DIAGNOSIS — I10 Essential (primary) hypertension: Secondary | ICD-10-CM | POA: Insufficient documentation

## 2020-01-14 DIAGNOSIS — K224 Dyskinesia of esophagus: Secondary | ICD-10-CM | POA: Diagnosis not present

## 2020-01-14 DIAGNOSIS — Z79899 Other long term (current) drug therapy: Secondary | ICD-10-CM | POA: Diagnosis not present

## 2020-01-14 DIAGNOSIS — X58XXXA Exposure to other specified factors, initial encounter: Secondary | ICD-10-CM | POA: Diagnosis not present

## 2020-01-14 DIAGNOSIS — Y999 Unspecified external cause status: Secondary | ICD-10-CM | POA: Diagnosis not present

## 2020-01-14 DIAGNOSIS — S29012A Strain of muscle and tendon of back wall of thorax, initial encounter: Secondary | ICD-10-CM | POA: Diagnosis not present

## 2020-01-14 DIAGNOSIS — S29019A Strain of muscle and tendon of unspecified wall of thorax, initial encounter: Secondary | ICD-10-CM

## 2020-01-14 DIAGNOSIS — Y929 Unspecified place or not applicable: Secondary | ICD-10-CM | POA: Diagnosis not present

## 2020-01-14 DIAGNOSIS — Z8673 Personal history of transient ischemic attack (TIA), and cerebral infarction without residual deficits: Secondary | ICD-10-CM | POA: Insufficient documentation

## 2020-01-14 DIAGNOSIS — R1110 Vomiting, unspecified: Secondary | ICD-10-CM | POA: Insufficient documentation

## 2020-01-14 DIAGNOSIS — M549 Dorsalgia, unspecified: Secondary | ICD-10-CM | POA: Diagnosis present

## 2020-01-14 LAB — CBC WITH DIFFERENTIAL/PLATELET
Abs Immature Granulocytes: 0.08 10*3/uL — ABNORMAL HIGH (ref 0.00–0.07)
Basophils Absolute: 0 10*3/uL (ref 0.0–0.1)
Basophils Relative: 0 %
Eosinophils Absolute: 0 10*3/uL (ref 0.0–0.5)
Eosinophils Relative: 0 %
HCT: 41.6 % (ref 36.0–46.0)
Hemoglobin: 14.2 g/dL (ref 12.0–15.0)
Immature Granulocytes: 1 %
Lymphocytes Relative: 9 %
Lymphs Abs: 1.1 10*3/uL (ref 0.7–4.0)
MCH: 34 pg (ref 26.0–34.0)
MCHC: 34.1 g/dL (ref 30.0–36.0)
MCV: 99.5 fL (ref 80.0–100.0)
Monocytes Absolute: 0.5 10*3/uL (ref 0.1–1.0)
Monocytes Relative: 4 %
Neutro Abs: 10.8 10*3/uL — ABNORMAL HIGH (ref 1.7–7.7)
Neutrophils Relative %: 86 %
Platelets: 294 10*3/uL (ref 150–400)
RBC: 4.18 MIL/uL (ref 3.87–5.11)
RDW: 12.5 % (ref 11.5–15.5)
WBC: 12.5 10*3/uL — ABNORMAL HIGH (ref 4.0–10.5)
nRBC: 0 % (ref 0.0–0.2)

## 2020-01-14 LAB — URINALYSIS, ROUTINE W REFLEX MICROSCOPIC
Bilirubin Urine: NEGATIVE
Glucose, UA: NEGATIVE mg/dL
Hgb urine dipstick: NEGATIVE
Ketones, ur: NEGATIVE mg/dL
Leukocytes,Ua: NEGATIVE
Nitrite: NEGATIVE
Protein, ur: NEGATIVE mg/dL
Specific Gravity, Urine: 1.006 (ref 1.005–1.030)
pH: 6 (ref 5.0–8.0)

## 2020-01-14 LAB — BASIC METABOLIC PANEL
Anion gap: 9 (ref 5–15)
BUN: 26 mg/dL — ABNORMAL HIGH (ref 8–23)
CO2: 26 mmol/L (ref 22–32)
Calcium: 9.3 mg/dL (ref 8.9–10.3)
Chloride: 102 mmol/L (ref 98–111)
Creatinine, Ser: 0.9 mg/dL (ref 0.44–1.00)
GFR calc Af Amer: >60 mL/min (ref >60)
GFR calc non Af Amer: >60 mL/min (ref >60)
Glucose, Bld: 129 mg/dL — ABNORMAL HIGH (ref 70–99)
Potassium: 3.3 mmol/L — ABNORMAL LOW (ref 3.5–5.1)
Sodium: 137 mmol/L (ref 135–145)

## 2020-01-14 MED ORDER — MORPHINE SULFATE (PF) 4 MG/ML IV SOLN
4.0000 mg | Freq: Once | INTRAVENOUS | Status: AC
  Administered 2020-01-14: 01:00:00 4 mg via INTRAVENOUS
  Filled 2020-01-14: qty 1

## 2020-01-14 MED ORDER — HYDROCODONE-ACETAMINOPHEN 5-325 MG PO TABS: 1.0000 | ORAL_TABLET | ORAL | 0 refills | Status: DC | PRN

## 2020-01-14 MED ORDER — ONDANSETRON HCL 4 MG/2ML IJ SOLN
4.0000 mg | Freq: Once | INTRAMUSCULAR | Status: AC
  Administered 2020-01-14: 4 mg via INTRAVENOUS
  Filled 2020-01-14: qty 2

## 2020-01-14 NOTE — ED Provider Notes (Signed)
[place] Provider Note   CSN: [id number] Arrival date & time: 01/14/20  0030     History Chief Complaint  Patient presents with  . Back Pain    Brittany Phillips is a 79 y.o. female.  Patient presents to the emergency department for evaluation of left-sided back pain.  Patient reports that symptoms began 3 days ago.  She had an episode of vomiting prior to onset of the pain.  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  She saw her primary doctor the next day and he told her that he thought she had pulled a muscle when she was vomiting.  He did an injection in the area but it has not helped the pain.  She is not taking any pain medications at home, reports the pain became severe tonight.  She has not had any further nausea or vomiting.  No diarrhea constipation.  No urinary symptoms.  Patient denies chest pain and shortness of breath.        Past Medical History:  Diagnosis Date  . Anemia   . Anxiety   . Cervicalgia   . Depression   . Dysrhythmia   . GERD (gastroesophageal reflux disease)   . Headache   . History of hiatal hernia   . Hyperlipidemia   . Hyperlipidemia   . Hypertension   . Loss of hearing    aids  . Lumbar disc disease   . Migraines   . Neuralgia   . Post menopausal problems   . PVC's (premature ventricular contractions)   . Rosacea   . TIA (transient ischemic attack) 2017  . Vitamin D deficiency     There are no problems to display for this patient.   Past Surgical History:  Procedure Laterality Date  . ABDOMINAL HYSTERECTOMY    . BUNIONECTOMY    . CATARACT EXTRACTION W/PHACO Right 02/22/2018   Procedure: CATARACT EXTRACTION PHACO AND INTRAOCULAR LENS PLACEMENT (IOC) RIGHT;  Surgeon: Eulogio Bear, MD;  Location: [place];  Service: Ophthalmology;  Laterality: Right;  . CATARACT EXTRACTION W/PHACO Left 03/29/2018   Procedure: CATARACT EXTRACTION PHACO AND  INTRAOCULAR LENS PLACEMENT ([place]) LEFT IVA TOPICAL;  Surgeon: Eulogio Bear, MD;  Location: [place];  Service: Ophthalmology;  Laterality: Left;  . CHOLECYSTECTOMY    . COLONOSCOPY WITH PROPOFOL N/A 06/27/2015   Procedure: COLONOSCOPY WITH PROPOFOL;  Surgeon: Manya Silvas, MD;  [facility] ENDOSCOPY;  Service: Endoscopy;  Laterality: N/A;  . COLONOSCOPY, ESOPHAGOGASTRODUODENOSCOPY (EGD) AND ESOPHAGEAL DILATION    . ESOPHAGOGASTRODUODENOSCOPY (EGD) WITH PROPOFOL N/A 06/27/2015   Procedure: ESOPHAGOGASTRODUODENOSCOPY (EGD) WITH PROPOFOL;  Surgeon: Manya Silvas, MD;  Location: [facility] ENDOSCOPY;  Service: Endoscopy;  Laterality: N/A;  . SAVORY DILATION  06/27/2015   Procedure: SAVORY DILATION;  Surgeon: Manya Silvas, MD;  Location: [facility] ENDOSCOPY;  Service: Endoscopy;;  . TONSILLECTOMY       OB History   No obstetric history on file.     Family History  Problem Relation Age of Onset  . Breast cancer Neg Hx     Social History   Tobacco Use  . Smoking status: Never Smoker  . Smokeless tobacco: Never Used  Substance Use Topics  . Alcohol use: No  . Drug use: No    Home Medications Prior to Admission medications   Medication Sig Start Date End Date Taking? Authorizing Provider  ALPRAZolam Duanne Moron) 0.25 MG tablet Take 0.25 mg by mouth  2 (two) times daily.    [provider]  aspirin 325 MG tablet Take 325 mg by mouth daily.    [provider]  aspirin EC 81 MG tablet Take 81 mg by mouth at bedtime.    [provider]  aspirin-sod bicarb-citric acid (ALKA-SELTZER) 325 MG TBEF tablet Take 650 mg by mouth every 6 (six) hours as needed (for indigestion).    [provider]  butalbital-acetaminophen-caffeine (FIORICET, ESGIC) 50-325-40 MG tablet Take 1 tablet by mouth 2 (two) times daily as needed for migraine.    [provider]  Cholecalciferol (VITAMIN D3) 2000 units TABS Take by mouth.    [provider]    cyclobenzaprine (FLEXERIL) 5 MG tablet Take 5 mg by mouth at bedtime.    [provider]  doxycycline (DORYX) 100 MG EC tablet Take 100 mg by mouth 2 (two) times daily.    [provider]  estradiol (ESTRACE) 1 MG tablet Take 1 mg by mouth daily.    [provider]  etodolac (LODINE) 400 MG tablet Take 400 mg by mouth 2 (two) times daily as needed for mild pain.     [provider]  fluticasone (VERAMYST) 27.5 MCG/SPRAY nasal spray Place 2 sprays into both nostrils at bedtime.     [provider]  gabapentin (NEURONTIN) 100 MG capsule Take 100 mg by mouth 2 (two) times daily.     [provider]  HYDROcodone-acetaminophen (NORCO/VICODIN) 5-325 MG tablet Take 1-2 tablets by mouth every 4 (four) hours as needed. 01/14/20   Gilda Crease, MD  HYDROcodone-acetaminophen (NORCO/VICODIN) 5-325 MG tablet Take 1 tablet by mouth every 4 (four) hours as needed for moderate pain. 01/14/20   Gilda Crease, MD  levothyroxine (SYNTHROID, LEVOTHROID) 75 MCG tablet Take 75 mcg by mouth daily before breakfast.    [provider]  losartan-hydrochlorothiazide (HYZAAR) 50-12.5 MG tablet Take 1 tablet by mouth daily.    [provider]  losartan-hydrochlorothiazide (HYZAAR) 50-12.5 MG tablet Take 1 tablet by mouth daily.    [provider]  omeprazole (PRILOSEC) 20 MG capsule Take 20 mg by mouth 2 (two) times daily before a meal.     [provider]  propranolol (INDERAL) 40 MG tablet Take 40 mg by mouth daily.     [provider]  venlafaxine (EFFEXOR) 37.5 MG tablet Take 18.75 mg by mouth daily.    [provider]  vitamin B-12 (CYANOCOBALAMIN) 1000 MCG tablet Take 1,000 mcg by mouth daily.    [provider]    Allergies    Codeine and Tizanidine  Review of Systems   Review of Systems  Musculoskeletal: Positive for back pain.  All other systems reviewed and are  negative.   Physical Exam Updated Vital Signs BP (!) 149/77 (BP Location: Right Arm)   Pulse 62   Temp 97.9 F (36.6 C) (Oral)   Resp 18   SpO2 98%   Physical Exam Vitals and nursing note reviewed.  Constitutional:      General: She is not in acute distress.    Appearance: Normal appearance. She is well-developed.  HENT:     Head: Normocephalic and atraumatic.     Right Ear: Hearing normal.     Left Ear: Hearing normal.     Nose: Nose normal.  Eyes:     Conjunctiva/sclera: Conjunctivae normal.     Pupils: Pupils are equal, round, and reactive to light.  Cardiovascular:     Rate and  Rhythm: Regular rhythm.     Heart sounds: S1 normal and S2 normal. No murmur. No friction rub. No gallop.   Pulmonary:     Effort: Pulmonary effort is normal. No respiratory distress.     Breath sounds: Normal breath sounds.  Chest:     Chest wall: No tenderness.  Abdominal:     General: Bowel sounds are normal.     Palpations: Abdomen is soft.     Tenderness: There is no abdominal tenderness. There is no guarding or rebound. Negative signs include Murphy's sign and McBurney's sign.     Hernia: No hernia is present.  Musculoskeletal:        General: Normal range of motion.     Cervical back: Normal range of motion and neck supple.     Thoracic back: Tenderness present.       Back:  Skin:    General: Skin is warm and dry.     Findings: No rash.  Neurological:     Mental Status: She is alert and oriented to person, place, and time.     GCS: GCS eye subscore is 4. GCS verbal subscore is 5. GCS motor subscore is 6.     Cranial Nerves: No cranial nerve deficit.     Sensory: No sensory deficit.     Coordination: Coordination normal.  Psychiatric:        Speech: Speech normal.        Behavior: Behavior normal.        Thought Content: Thought content normal.     ED Results / Procedures / Treatments   Labs (all labs ordered are listed, but only abnormal results are displayed) Labs  Reviewed  CBC WITH DIFFERENTIAL/PLATELET - Abnormal; Notable for the following components:      Result Value   WBC 12.5 (*)    Neutro Abs 10.8 (*)    Abs Immature Granulocytes 0.08 (*)    All other components within normal limits  BASIC METABOLIC PANEL - Abnormal; Notable for the following components:   Potassium 3.3 (*)    Glucose, Bld 129 (*)    BUN 26 (*)    All other components within normal limits  URINALYSIS, ROUTINE W REFLEX MICROSCOPIC - Abnormal; Notable for the following components:   Color, Urine STRAW (*)    All other components within normal limits    EKG None  Radiology CT RENAL STONE STUDY  Result Date: 01/14/2020 CLINICAL DATA:  Mid back pain for 4 days, gastroesophageal reflux EXAM: CT ABDOMEN AND PELVIS WITHOUT CONTRAST TECHNIQUE: Multidetector CT imaging of the abdomen and pelvis was performed following the standard protocol without IV contrast. COMPARISON:  09/06/2017 FINDINGS: Lower chest: No acute pleural or parenchymal lung disease. Hepatobiliary: No focal liver abnormality is seen. Status post cholecystectomy. No biliary dilatation. Pancreas: Unremarkable. No pancreatic ductal dilatation or surrounding inflammatory changes. Spleen: Normal in size without focal abnormality. Adrenals/Urinary Tract: There is a 3 mm nonobstructing calculus lower pole right kidney, reference image 31. No left renal calculi. No obstructive uropathy within either kidney. There are small bilateral renal cortical cysts. Otherwise the kidneys are unremarkable. The adrenals are grossly normal. The bladder is moderately distended without focal abnormality. Stomach/Bowel: No bowel obstruction or ileus. The appendix is not identified. No inflammatory changes. Vascular/Lymphatic: Minimal atherosclerosis of the abdominal aorta. No pathologic adenopathy. Reproductive: Status post hysterectomy. No adnexal masses. Other: No abdominal wall hernia or abnormality. No abdominopelvic ascites. Musculoskeletal:  No acute or destructive bony lesions. Prominent spondylosis  at L3/L4 and L5/S1. Intramuscular lipoma left gluteal musculature. Reconstructed images demonstrate no additional findings. IMPRESSION: 1. Nonobstructing 3 mm right renal calculus. 2. Otherwise no acute intra-abdominal or intrapelvic process. Electronically Signed   By: Sharlet Salina M.D.   On: 01/14/2020 01:51    Procedures Procedures (including critical care time)  Medications Ordered in ED Medications  morphine 4 MG/ML injection 4 mg (4 mg Intravenous Given 01/14/20 0126)  ondansetron (ZOFRAN) injection 4 mg (4 mg Intravenous Given 01/14/20 0127)    ED Course  I have reviewed the triage vital signs and the nursing notes.  Pertinent labs & imaging results that were available during my care of the patient were reviewed by me and considered in my medical decision making (see chart for details).    MDM Rules/Calculators/A&P                      Patient presents to the emergency department for evaluation of back pain.  Patient been having "spasms" of the left side of her back for the last few days.  Pain began after an episode of vomiting.  She is not experiencing any chest pain or shortness of breath.  No concern for esophageal rupture or intrathoracic pathology.  Blood work unremarkable.  Urinalysis did not suggest infection.  CT scan does not show any ureterolithiasis or other intra-abdominal pathology.  Patient feeling much improved after dose of morphine.  We will continue analgesia, rest, follow-up with PCP.  Final Clinical Impression(s) / ED Diagnoses Final diagnoses:  Thoracic myofascial strain, initial encounter    Rx / DC Orders ED Discharge Orders         Ordered    HYDROcodone-acetaminophen (NORCO/VICODIN) 5-325 MG tablet  Every 4 hours PRN     01/14/20 0234    HYDROcodone-acetaminophen (NORCO/VICODIN) 5-325 MG tablet  Every 4 hours PRN     01/14/20 0234           Gilda Crease, MD 01/14/20 [phone]

## 2020-01-14 NOTE — ED Triage Notes (Signed)
Pt C/O back pain in the lumbar region. Pt states she saw her PCP on 2/26 and was given a "cortisone shot" in her back. Pt states her back pain was better yesterday morning and had gotten worse "by diner time."

## 2020-01-14 NOTE — ED Notes (Signed)
MD at bedside. 

## 2020-01-15 MED FILL — Hydrocodone-Acetaminophen Tab 5-325 MG: ORAL | Qty: 6 | Status: AC

## 2020-02-21 ENCOUNTER — Ambulatory Visit: Payer: Medicare HMO | Admitting: Occupational Therapy

## 2020-03-11 ENCOUNTER — Emergency Department: Payer: Medicare HMO

## 2020-03-11 ENCOUNTER — Emergency Department
Admission: EM | Admit: 2020-03-11 | Discharge: 2020-03-11 | Disposition: A | Discharge status: Home / Self Care | Payer: Medicare HMO | Attending: Emergency Medicine | Admitting: Emergency Medicine

## 2020-03-11 ENCOUNTER — Other Ambulatory Visit: Payer: Self-pay

## 2020-03-11 ENCOUNTER — Encounter: Payer: Self-pay | Admitting: Emergency Medicine

## 2020-03-11 DIAGNOSIS — S0990XA Unspecified injury of head, initial encounter: Secondary | ICD-10-CM | POA: Insufficient documentation

## 2020-03-11 DIAGNOSIS — Z79899 Other long term (current) drug therapy: Secondary | ICD-10-CM | POA: Insufficient documentation

## 2020-03-11 DIAGNOSIS — Y92512 Supermarket, store or market as the place of occurrence of the external cause: Secondary | ICD-10-CM | POA: Insufficient documentation

## 2020-03-11 DIAGNOSIS — W19XXXA Unspecified fall, initial encounter: Secondary | ICD-10-CM

## 2020-03-11 DIAGNOSIS — R519 Headache, unspecified: Secondary | ICD-10-CM | POA: Diagnosis not present

## 2020-03-11 DIAGNOSIS — I10 Essential (primary) hypertension: Secondary | ICD-10-CM | POA: Insufficient documentation

## 2020-03-11 DIAGNOSIS — Z7982 Long term (current) use of aspirin: Secondary | ICD-10-CM | POA: Diagnosis not present

## 2020-03-11 DIAGNOSIS — Z8673 Personal history of transient ischemic attack (TIA), and cerebral infarction without residual deficits: Secondary | ICD-10-CM | POA: Insufficient documentation

## 2020-03-11 DIAGNOSIS — Y998 Other external cause status: Secondary | ICD-10-CM | POA: Diagnosis not present

## 2020-03-11 DIAGNOSIS — Y9301 Activity, walking, marching and hiking: Secondary | ICD-10-CM | POA: Insufficient documentation

## 2020-03-11 DIAGNOSIS — W010XXA Fall on same level from slipping, tripping and stumbling without subsequent striking against object, initial encounter: Secondary | ICD-10-CM | POA: Insufficient documentation

## 2020-03-11 MED ORDER — TRAMADOL HCL 50 MG PO TABS
50.0000 mg | ORAL_TABLET | Freq: Once | ORAL | Status: AC
  Administered 2020-03-11: 12:00:00 50 mg via ORAL
  Filled 2020-03-11: qty 1

## 2020-03-11 NOTE — ED Provider Notes (Signed)
[facility] Emergency Department Provider Note  ____________________________________________  Time seen: Approximately 12:18 PM  I have reviewed the triage vital signs and the nursing notes.   HISTORY  Chief Complaint Fall    HPI Brittany Phillips is a 79 y.o. female that presents to the emergency department for evaluation after fall.  Patient was at the store when she was walking around her cart and thinks she tripped on the front causing her to fall backwards.  She landed on her buttocks and then hit the back of her head.  She did not feel dizzy or feel bad before the fall.  She takes an aspirin daily but no blood thinners.  She did not lose consciousness.  She has had a headache since.  Her headache was constant at first but now is intermittent.  Her low back is sore but nothing out of the ordinary.  No dizziness, visual changes, shortness of breath, chest pain, abdominal pain.  Past Medical History:  Diagnosis Date  . Anemia   . Anxiety   . Cervicalgia   . Depression   . Dysrhythmia   . GERD (gastroesophageal reflux disease)   . Headache   . History of hiatal hernia   . Hyperlipidemia   . Hyperlipidemia   . Hypertension   . Loss of hearing    aids  . Lumbar disc disease   . Migraines   . Neuralgia   . Post menopausal problems   . PVC's (premature ventricular contractions)   . Rosacea   . TIA (transient ischemic attack) 2017  . Vitamin D deficiency     There are no problems to display for this patient.   Past Surgical History:  Procedure Laterality Date  . ABDOMINAL HYSTERECTOMY    . BUNIONECTOMY    . CATARACT EXTRACTION W/PHACO Right 02/22/2018   Procedure: CATARACT EXTRACTION PHACO AND INTRAOCULAR LENS PLACEMENT (IOC) RIGHT;  Surgeon: Nevada Crane, MD;  Location: [facility];  Service: Ophthalmology;  Laterality: Right;  . CATARACT EXTRACTION W/PHACO Left 03/29/2018   Procedure: CATARACT EXTRACTION PHACO AND INTRAOCULAR LENS  PLACEMENT (IOC) LEFT IVA TOPICAL;  Surgeon: Nevada Crane, MD;  Location: [facility];  Service: Ophthalmology;  Laterality: Left;  . CHOLECYSTECTOMY    . COLONOSCOPY WITH PROPOFOL N/A 06/27/2015   Procedure: COLONOSCOPY WITH PROPOFOL;  Surgeon: Scot Jun, MD;  Location: [facility];  Service: Endoscopy;  Laterality: N/A;  . COLONOSCOPY, ESOPHAGOGASTRODUODENOSCOPY (EGD) AND ESOPHAGEAL DILATION    . ESOPHAGOGASTRODUODENOSCOPY (EGD) WITH PROPOFOL N/A 06/27/2015   Procedure: ESOPHAGOGASTRODUODENOSCOPY (EGD) WITH PROPOFOL;  Surgeon: Scot Jun, MD;  Location: [facility] ENDOSCOPY;  Service: Endoscopy;  Laterality: N/A;  . SAVORY DILATION  06/27/2015   Procedure: SAVORY DILATION;  Surgeon: Scot Jun, MD;  Location: [facility] ENDOSCOPY;  Service: Endoscopy;;  . TONSILLECTOMY      Prior to Admission medications   Medication Sig Start Date End Date Taking? Authorizing Provider  ALPRAZolam (XANAX) 0.25 MG tablet Take 0.25 mg by mouth 2 (two) times daily.    [provider]  aspirin 325 MG tablet Take 325 mg by mouth daily.    [provider]  aspirin EC 81 MG tablet Take 81 mg by mouth at bedtime.    [provider]  aspirin-sod bicarb-citric acid (ALKA-SELTZER) 325 MG TBEF tablet Take 650 mg by mouth every 6 (six) hours as needed (for indigestion).    [provider]  butalbital-acetaminophen-caffeine (FIORICET, ESGIC) 50-325-40 MG tablet Take 1 tablet  by mouth 2 (two) times daily as needed for migraine.    [provider]  Cholecalciferol (VITAMIN D3) 2000 units TABS Take by mouth.    [provider]  cyclobenzaprine (FLEXERIL) 5 MG tablet Take 5 mg by mouth at bedtime.    [provider]  doxycycline (DORYX) 100 MG EC tablet Take 100 mg by mouth 2 (two) times daily.    [provider]  estradiol (ESTRACE) 1 MG tablet Take 1 mg by mouth daily.    [provider]  etodolac (LODINE) 400 MG tablet Take  400 mg by mouth 2 (two) times daily as needed for mild pain.     [provider]  fluticasone (VERAMYST) 27.5 MCG/SPRAY nasal spray Place 2 sprays into both nostrils at bedtime.     [provider]  gabapentin (NEURONTIN) 100 MG capsule Take 100 mg by mouth 2 (two) times daily.     [provider]  HYDROcodone-acetaminophen (NORCO/VICODIN) 5-325 MG tablet Take 1-2 tablets by mouth every 4 (four) hours as needed. 01/14/20   Orpah Greek, MD  HYDROcodone-acetaminophen (NORCO/VICODIN) 5-325 MG tablet Take 1 tablet by mouth every 4 (four) hours as needed for moderate pain. 01/14/20   Orpah Greek, MD  levothyroxine (SYNTHROID, LEVOTHROID) 75 MCG tablet Take 75 mcg by mouth daily before breakfast.    [provider]  losartan-hydrochlorothiazide (HYZAAR) 50-12.5 MG tablet Take 1 tablet by mouth daily.    [provider]  losartan-hydrochlorothiazide (HYZAAR) 50-12.5 MG tablet Take 1 tablet by mouth daily.    [provider]  omeprazole (PRILOSEC) 20 MG capsule Take 20 mg by mouth 2 (two) times daily before a meal.     [provider]  propranolol (INDERAL) 40 MG tablet Take 40 mg by mouth daily.     [provider]  venlafaxine (EFFEXOR) 37.5 MG tablet Take 18.75 mg by mouth daily.    [provider]  vitamin B-12 (CYANOCOBALAMIN) 1000 MCG tablet Take 1,000 mcg by mouth daily.    [provider]    Allergies Codeine and Tizanidine  Family History  Problem Relation Age of Onset  . Breast cancer Neg Hx     Social History Social History   Tobacco Use  . Smoking status: Never Smoker  . Smokeless tobacco: Never Used  Substance Use Topics  . Alcohol use: No  . Drug use: No     Review of Systems  Cardiovascular: No chest pain. Respiratory: No SOB. Gastrointestinal: No abdominal pain.  No nausea, no vomiting.  Musculoskeletal: Negative for musculoskeletal pain. Skin: Negative for  rash, abrasions, lacerations, ecchymosis. Neurological: Negative for numbness or tingling. Positive for headache.   ____________________________________________   PHYSICAL EXAM:  VITAL SIGNS: ED Triage Vitals  Enc Vitals Group     BP 03/11/20 1031 (!) 155/80     Pulse Rate 03/11/20 1031 64     Resp 03/11/20 1031 14     Temp 03/11/20 1031 98.6 F (37 C)     Temp Source 03/11/20 1031 Oral     SpO2 03/11/20 1031 98 %     Weight 03/11/20 1032 146 lb (66.2 kg)     Height 03/11/20 1032 5\' 2"  (1.575 m)     Head Circumference --      Peak Flow --      Pain Score 03/11/20 1031 8     Pain Loc --      Pain Edu? --      Excl. in  GC? --      Constitutional: Alert and oriented. Well appearing and in no acute distress. Eyes: Conjunctivae are normal. PERRL. EOMI. Head: Atraumatic. ENT:      Ears:      Nose: No congestion/rhinnorhea.      Mouth/Throat: Mucous membranes are moist.  Neck: No stridor.  No cervical spine tenderness to palpation. Cardiovascular: Normal rate, regular rhythm.  Good peripheral circulation. Respiratory: Normal respiratory effort without tachypnea or retractions. Lungs CTAB. Good air entry to the bases with no decreased or absent breath sounds. Musculoskeletal: Full range of motion to all extremities. No gross deformities appreciated.  No tenderness to palpation over lumbar spine.  Full range of motion of bilateral hips. Neurologic:  Normal speech and language. No gross focal neurologic deficits are appreciated.  Skin:  Skin is warm, dry and intact. No rash noted. Psychiatric: Mood and affect are normal. Speech and behavior are normal. Patient exhibits appropriate insight and judgement.   ____________________________________________   LABS (all labs ordered are listed, but only abnormal results are displayed)  Labs Reviewed - No data to  display ____________________________________________  EKG   ____________________________________________  RADIOLOGY Lexine Baton, personally viewed and evaluated these images (plain radiographs) as part of my medical decision making, as well as reviewing the written report by the radiologist.  DG Pelvis 1-2 Views  Result Date: 03/11/2020 CLINICAL DATA:  Pain following fall EXAM: PELVIS - 1-2 VIEW COMPARISON:  None. FINDINGS: There is no evidence of pelvic fracture or dislocation. There is moderate symmetric narrowing of each hip joint. No erosive change. 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 No fracture or dislocation. Electronically Signed   By: Bretta Bang III M.D.   On: 03/11/2020 12:32   CT Head Wo Contrast  Result Date: 03/11/2020 CLINICAL DATA:  Fall with head striking floor. EXAM: CT HEAD WITHOUT CONTRAST CT CERVICAL SPINE WITHOUT CONTRAST TECHNIQUE: Multidetector CT imaging of the head and cervical spine was performed following the standard protocol without intravenous contrast. Multiplanar CT image reconstructions of the cervical spine were also generated. COMPARISON:  None. FINDINGS: CT HEAD FINDINGS Brain: No intracranial hemorrhage. No parenchymal contusion. No midline shift or mass effect. Basilar cisterns are patent. No skull base fracture. No fluid in the paranasal sinuses or mastoid air cells. Orbits are normal. Vascular: No hyperdense vessel or unexpected calcification. Skull: No skull fracture Sinuses/Orbits: No acute finding. Other: shallow scalp hematoma over skull vertex. CT CERVICAL SPINE FINDINGS Alignment: Normal alignment of the cervical vertebral bodies. Skull base and vertebrae: Normal craniocervical junction. No loss of vertebral body height or disc height. Normal facet articulation. No evidence of fracture. Soft tissues and spinal canal: No prevertebral soft tissue swelling. No perispinal or epidural hematoma. Disc levels: Moderate  multilevel facet hypertrophy most prominent lower cervical spine. There is mild anterolisthesis (1 mm) C6 on C7 which is favored degenerative. Upper chest: Clear Other: None IMPRESSION: 1. No intracranial trauma. 2. Shallow scalp hematoma over the vertex.  No skull fracture. 3. No cervical spine fracture. 4. Multi level facet hypertrophy and mild disc osteophytic disease. Electronically Signed   By: Genevive Bi M.D.   On: 03/11/2020 11:17   CT Cervical Spine Wo Contrast  Result Date: 03/11/2020 CLINICAL DATA:  Fall with head striking floor. EXAM: CT HEAD WITHOUT CONTRAST CT CERVICAL SPINE WITHOUT CONTRAST TECHNIQUE: Multidetector CT imaging of the head and cervical spine was performed following the standard protocol without intravenous contrast. Multiplanar CT image reconstructions of the cervical spine  were also generated. COMPARISON:  None. FINDINGS: CT HEAD FINDINGS Brain: No intracranial hemorrhage. No parenchymal contusion. No midline shift or mass effect. Basilar cisterns are patent. No skull base fracture. No fluid in the paranasal sinuses or mastoid air cells. Orbits are normal. Vascular: No hyperdense vessel or unexpected calcification. Skull: No skull fracture Sinuses/Orbits: No acute finding. Other: shallow scalp hematoma over skull vertex. CT CERVICAL SPINE FINDINGS Alignment: Normal alignment of the cervical vertebral bodies. Skull base and vertebrae: Normal craniocervical junction. No loss of vertebral body height or disc height. Normal facet articulation. No evidence of fracture. Soft tissues and spinal canal: No prevertebral soft tissue swelling. No perispinal or epidural hematoma. Disc levels: Moderate multilevel facet hypertrophy most prominent lower cervical spine. There is mild anterolisthesis (1 mm) C6 on C7 which is favored degenerative. Upper chest: Clear Other: None IMPRESSION: 1. No intracranial trauma. 2. Shallow scalp hematoma over the vertex.  No skull fracture. 3. No cervical  spine fracture. 4. Multi level facet hypertrophy and mild disc osteophytic disease. Electronically Signed   By: Genevive Bi M.D.   On: 03/11/2020 11:17    ____________________________________________    PROCEDURES  Procedure(s) performed:    Procedures    Medications  traMADol (ULTRAM) tablet 50 mg (50 mg Oral Given 03/11/20 1213)     ____________________________________________   INITIAL IMPRESSION / ASSESSMENT AND PLAN / ED COURSE  Pertinent labs & imaging results that were available during my care of the patient were reviewed by me and considered in my medical decision making (see chart for details).  Review of the [place] CSRS was performed in accordance of the [facility] prior to dispensing any controlled drugs.   Patient presented to the emergency department for evaluation of mechanical fall.  Vital signs and exam are reassuring.  CT head and cervical are negative for acute abnormalities.  Pelvis x-ray is negative for acute bony abnormalities.  Patient was given a dose of tramadol for headache in the emergency department.  Patient states the tramadol completely took away her pain.  She denies any pain currently.  She will continue taking Tylenol.  Patient is to follow up with primary care as directed. Patient is given ED precautions to return to the ED for any worsening or new symptoms.   Brittany Phillips was evaluated in Emergency Department on 03/11/2020 for the symptoms described in the history of present illness. She was evaluated in the context of the global COVID-19 pandemic, which necessitated consideration that the patient might be at risk for infection with the SARS-CoV-2 virus that causes COVID-19. Institutional protocols and algorithms that pertain to the evaluation of patients at risk for COVID-19 are in a state of rapid change based on information released by regulatory bodies including the CDC and federal and state organizations. These policies and algorithms were followed  during the patient's care in the ED.  ____________________________________________  FINAL CLINICAL IMPRESSION(S) / ED DIAGNOSES  Final diagnoses:  Fall, initial encounter      NEW MEDICATIONS STARTED DURING THIS VISIT:  ED Discharge Orders    None          This chart was dictated using voice recognition software/Dragon. Despite best efforts to proofread, errors can occur which can change the meaning. Any change was purely unintentional.    Enid Derry, PA-C 03/11/20 1618    Concha Se, MD 03/12/20 [phone]

## 2020-03-11 NOTE — ED Notes (Signed)
See triage note  Presents s/p fall  States she fell backwards  States she hit her head and her lower back/buttocks

## 2020-03-11 NOTE — ED Triage Notes (Signed)
Says she was in line at [employer] and she was going around her cart and fell backward onto her bottom and hitting hte back of her head on the floor.  She denies loss of consiousness and denies any dizziness prior.  Says she may have caught her foot on the cart when she was walking around it.  She has knot on back of head.says her lower back hurts-aches.  She is on full a 325 mg aspirin daily.

## 2020-03-11 NOTE — Discharge Instructions (Signed)
Your CT scans and your x-rays were reassuring.  Please ice the back of your head.  Use your walker to help you walk.  Please call Dr. Hyacinth Meeker this afternoon for a follow-up appointment tomorrow.

## 2020-05-13 DIAGNOSIS — M79641 Pain in right hand: Secondary | ICD-10-CM | POA: Insufficient documentation

## 2020-07-23 ENCOUNTER — Other Ambulatory Visit: Payer: Self-pay | Admitting: Rehabilitative and Restorative Service Providers"

## 2020-07-23 ENCOUNTER — Ambulatory Visit
Admission: RE | Admit: 2020-07-23 | Discharge: 2020-07-23 | Disposition: A | Discharge status: Home / Self Care | Payer: Medicare HMO | Source: Ambulatory Visit | Attending: Rehabilitative and Restorative Service Providers" | Admitting: Rehabilitative and Restorative Service Providers"

## 2020-07-23 ENCOUNTER — Other Ambulatory Visit: Payer: Self-pay

## 2020-07-23 DIAGNOSIS — R600 Localized edema: Secondary | ICD-10-CM | POA: Diagnosis present

## 2021-02-06 ENCOUNTER — Other Ambulatory Visit: Admission: RE | Admit: 2021-02-06 | Payer: Medicare HMO | Source: Ambulatory Visit

## 2021-02-10 ENCOUNTER — Ambulatory Visit: Admission: RE | Admit: 2021-02-10 | Payer: Medicare HMO | Source: Ambulatory Visit | Admitting: Internal Medicine

## 2021-02-10 ENCOUNTER — Encounter: Admission: RE | Payer: Self-pay | Source: Ambulatory Visit

## 2021-02-10 SURGERY — ESOPHAGOGASTRODUODENOSCOPY (EGD) WITH PROPOFOL
Anesthesia: General

## 2021-05-22 ENCOUNTER — Other Ambulatory Visit (HOSPITAL_COMMUNITY): Payer: Self-pay | Admitting: Gastroenterology

## 2021-05-22 ENCOUNTER — Other Ambulatory Visit: Payer: Self-pay | Admitting: Gastroenterology

## 2021-05-22 DIAGNOSIS — R1013 Epigastric pain: Secondary | ICD-10-CM

## 2021-05-22 DIAGNOSIS — R6881 Early satiety: Secondary | ICD-10-CM

## 2021-05-22 DIAGNOSIS — K2100 Gastro-esophageal reflux disease with esophagitis, without bleeding: Secondary | ICD-10-CM

## 2021-06-27 ENCOUNTER — Ambulatory Visit
Admission: RE | Admit: 2021-06-27 | Discharge: 2021-06-27 | Disposition: A | Discharge status: Home / Self Care | Payer: Medicare HMO | Source: Ambulatory Visit | Attending: Gastroenterology | Admitting: Gastroenterology

## 2021-06-27 ENCOUNTER — Other Ambulatory Visit: Payer: Self-pay

## 2021-06-27 DIAGNOSIS — K2100 Gastro-esophageal reflux disease with esophagitis, without bleeding: Secondary | ICD-10-CM | POA: Insufficient documentation

## 2021-06-27 DIAGNOSIS — R6881 Early satiety: Secondary | ICD-10-CM | POA: Insufficient documentation

## 2021-06-27 DIAGNOSIS — R1013 Epigastric pain: Secondary | ICD-10-CM | POA: Diagnosis present

## 2021-06-27 MED ORDER — TECHNETIUM TC 99M SULFUR COLLOID
2.0000 | Freq: Once | INTRAVENOUS | Status: AC | PRN
  Administered 2021-06-27: 2.35 via ORAL

## 2021-07-07 ENCOUNTER — Other Ambulatory Visit: Payer: Self-pay | Admitting: Family Medicine

## 2021-07-07 DIAGNOSIS — M5414 Radiculopathy, thoracic region: Secondary | ICD-10-CM

## 2021-07-16 ENCOUNTER — Ambulatory Visit
Admission: RE | Admit: 2021-07-16 | Discharge: 2021-07-16 | Disposition: A | Discharge status: Home / Self Care | Payer: Medicare HMO | Source: Ambulatory Visit | Attending: Family Medicine | Admitting: Family Medicine

## 2021-07-16 ENCOUNTER — Other Ambulatory Visit: Payer: Self-pay

## 2021-07-16 DIAGNOSIS — M5414 Radiculopathy, thoracic region: Secondary | ICD-10-CM | POA: Diagnosis present

## 2021-08-30 IMAGING — RF DG ESOPHAGUS
10 of 13 series · 14 of 24 positions shown · non-contrast
Comparison: None.

CLINICAL DATA: Dysphagia

EXAM:
ESOPHOGRAM / BARIUM SWALLOW / BARIUM TABLET STUDY
TECHNIQUE: Combined double contrast and single contrast examination performed
using effervescent crystals, thick barium liquid, and thin barium
liquid. The patient was observed with fluoroscopy swallowing a 13 mm
barium sulphate tablet.
FLUOROSCOPY TIME:  Fluoroscopy Time:  30 seconds
Radiation Exposure Index (if provided by the fluoroscopic device):
2.6 mGy
Number of Acquired Spot Images: 0

[Series 1: cp_standard · 0.25mm/px · 2 of 25 frames shown (1 of 10)]
[frame 4/25]
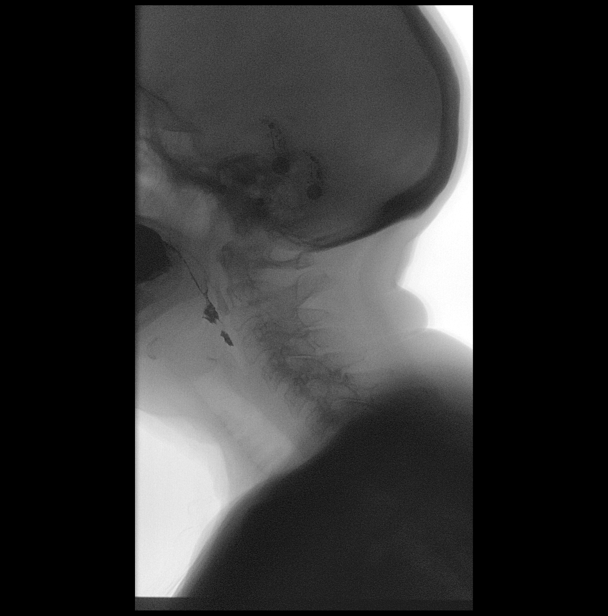
[frame 13/25]
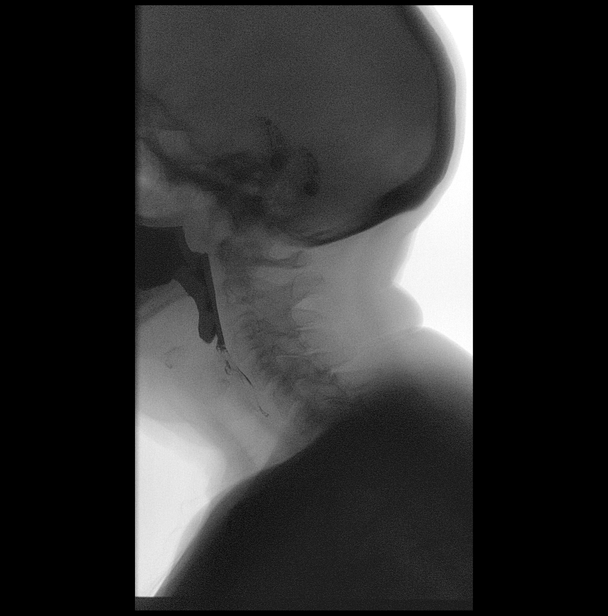

[Series 2: cp_standard · 0.25mm/px · 2 of 18 frames shown (2 of 10)]
[frame 3/18]
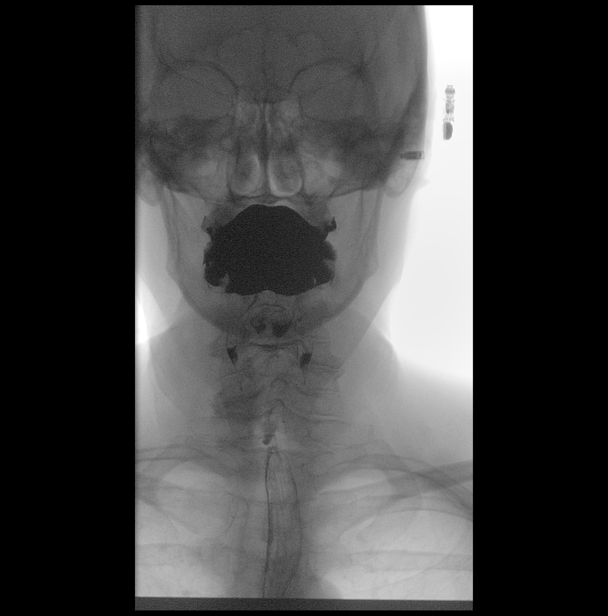
[frame 16/18]
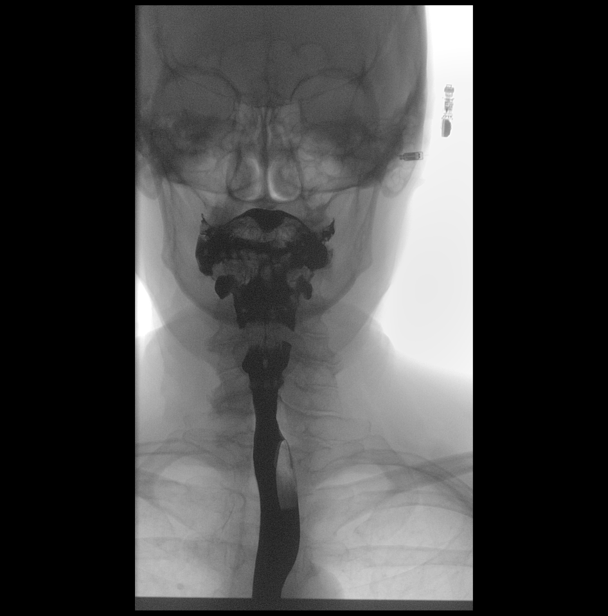

[Series 3: cp_standard · 0.25mm/px · 1 of 1 slices shown (3 of 10)]
[im 1/1]
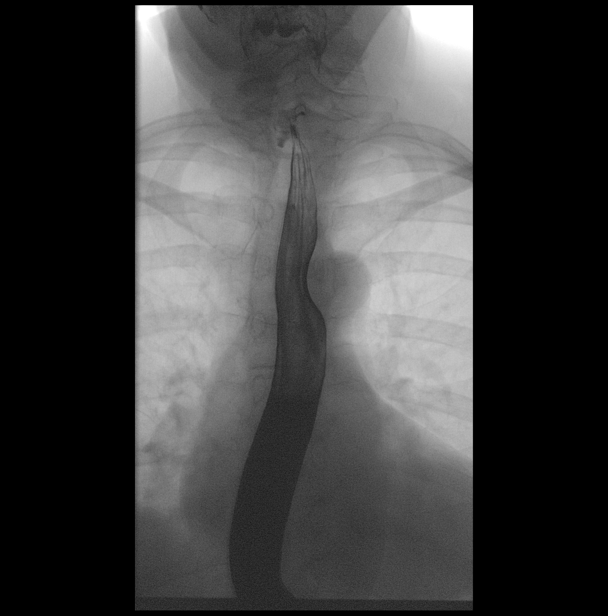

[Series 5: cp_standard · 0.25mm/px · 3 of 13 frames shown (4 of 10)]
[frame 2/13]
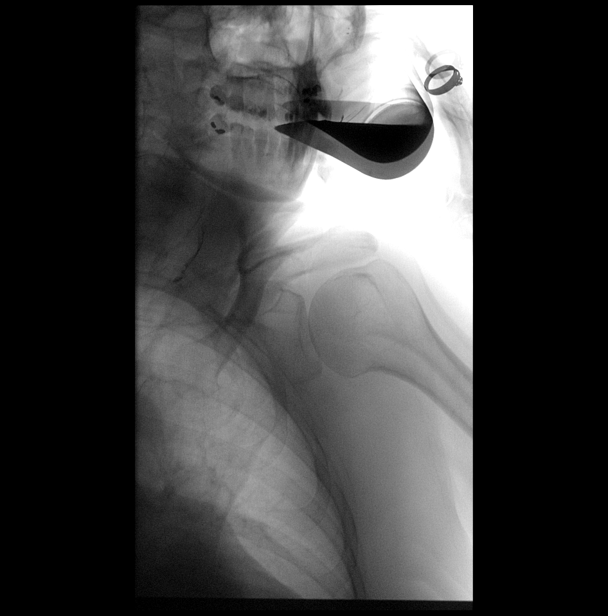
[frame 12/13]
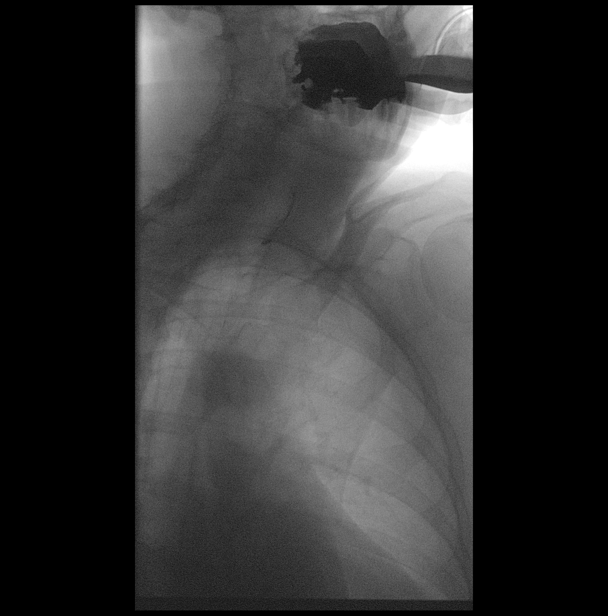
[frame 13/13]
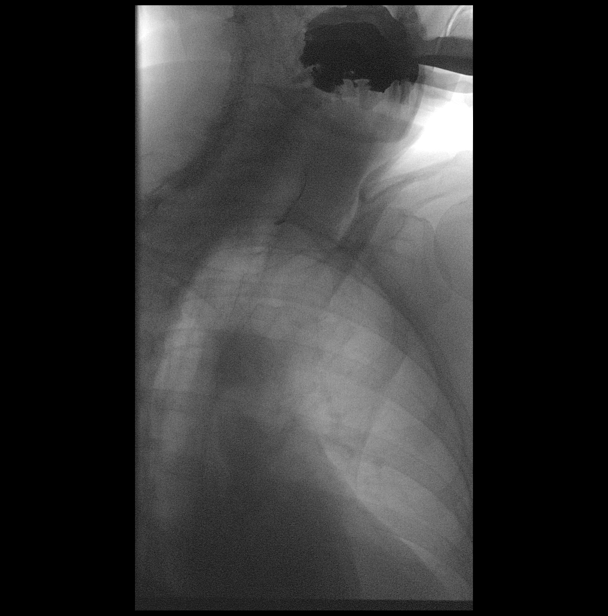

[Series 6: cp_standard · 0.25mm/px · 1 of 17 frames shown (5 of 10)]
[frame 9/17]
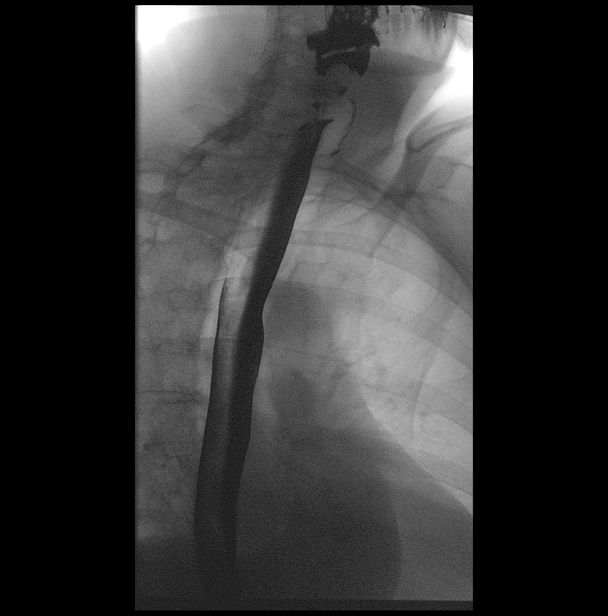

[Series 7: cp_standard · 0.25mm/px · 1 of 7 frames shown (6 of 10)]
[frame 2/7]
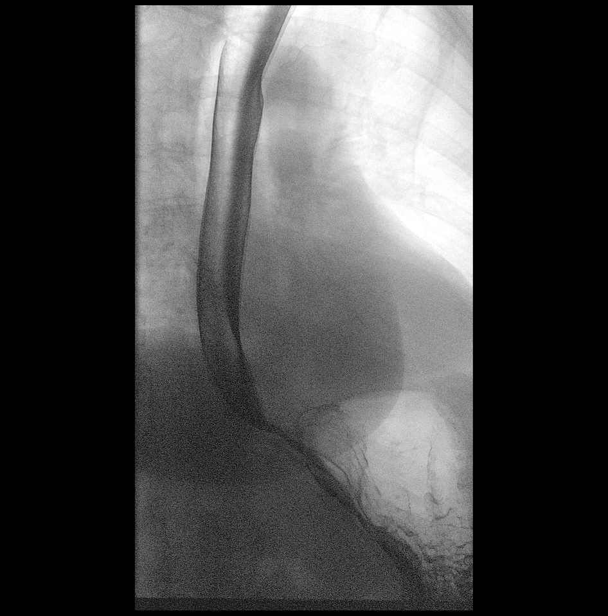

[Series 8: cp_standard · 0.25mm/px · 1 of 1 slices shown (7 of 10)]
[im 1/1]
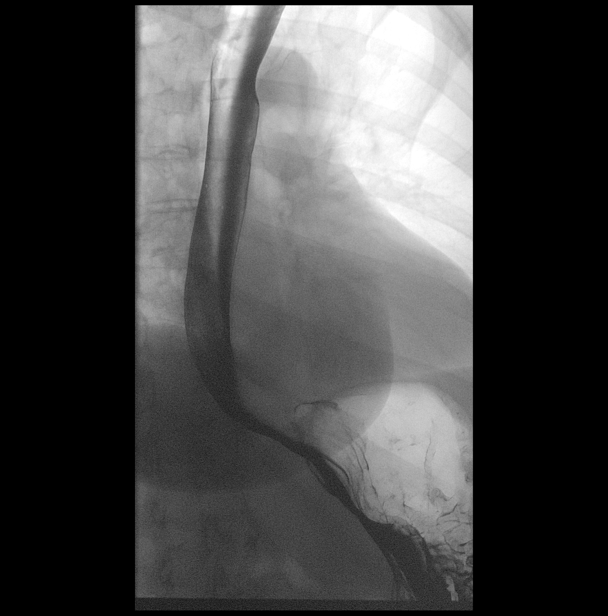

[Series 9: cp_standard · 0.25mm/px · 1 of 1 slices shown (8 of 10)]
[im 1/1]
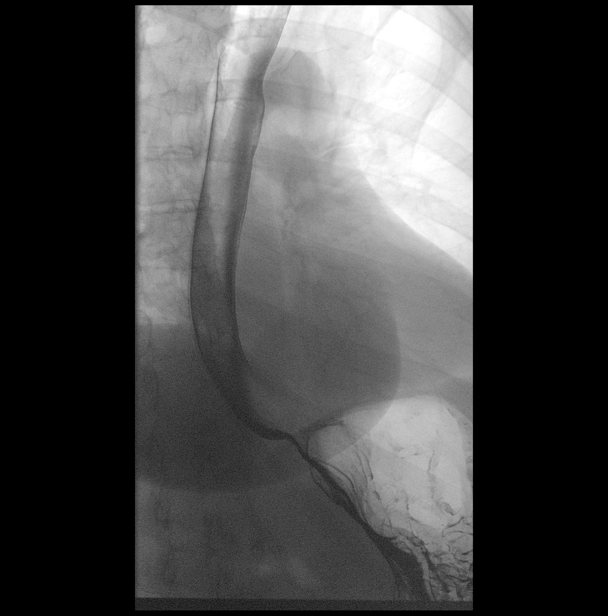

[Series 11: cp_standard · 0.28mm/px · 1 of 1 slices shown (9 of 10)]
[im 1/1]
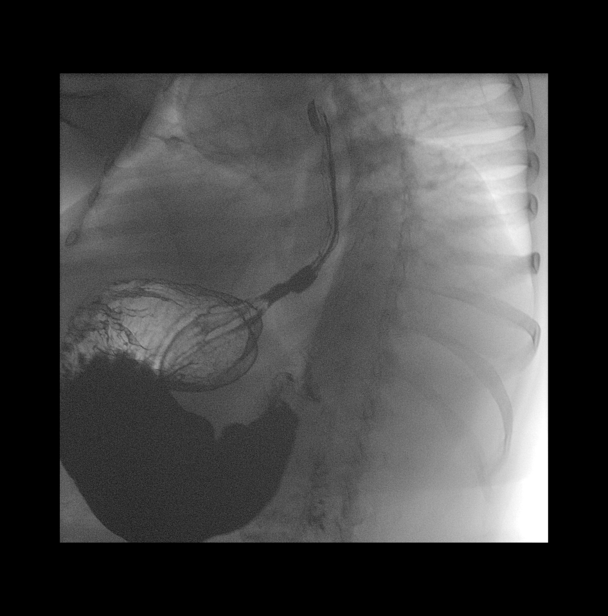

[Series 13: cp_standard · 0.28mm/px · 1 of 1 slices shown (10 of 10)]
[im 1/1]
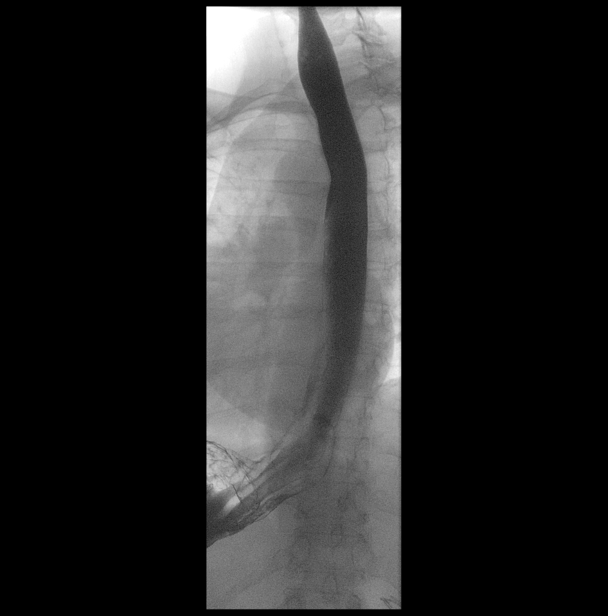

[14 of 24 positions shown; findings below may reference images not displayed]

FINDINGS: Normal pharyngeal anatomy and motility. Contrast flowed freely
through the esophagus without evidence of a stricture or mass.
Normal esophageal mucosa without evidence of irregularity or
ulceration. Esophageal motility was normal. Mild-moderate severity
gastroesophageal reflux. No definite hiatal hernia was demonstrated.

At the end of the examination a 13 mm barium tablet was administered
which transited through the esophagus and esophagogastric junction
without delay.
IMPRESSION: Mild-moderate severity gastroesophageal reflux.

Otherwise normal barium swallow.

## 2021-09-03 DIAGNOSIS — M5010 Cervical disc disorder with radiculopathy, unspecified cervical region: Secondary | ICD-10-CM | POA: Insufficient documentation

## 2022-01-27 DIAGNOSIS — N1832 Chronic kidney disease, stage 3b: Secondary | ICD-10-CM | POA: Insufficient documentation

## 2022-07-13 ENCOUNTER — Other Ambulatory Visit: Payer: Self-pay | Admitting: Podiatry

## 2022-08-03 ENCOUNTER — Encounter: Payer: Self-pay | Admitting: Podiatry

## 2022-08-05 NOTE — Discharge Instructions (Signed)
[facility]  POST OPERATIVE INSTRUCTIONS FOR DR. FOWLER AND [facility]   Take your medication as prescribed.  Pain medication should be taken only as needed.  Keep the dressing clean, dry and intact.  Keep your foot elevated above the heart level for the first 48 hours.  Walking to the bathroom and brief periods of walking are acceptable, unless we have instructed you to be non-weight bearing.  Always wear your post-op shoe when walking.  Always use your crutches if you are to be non-weight bearing.  Do not take a shower. Baths are permissible as long as the foot is kept out of the water.   Every hour you are awake:  Bend your knee 15 times. Flex foot 15 times Massage calf 15 times  Call [facility] ([phone]) if any of the following problems occur: You develop a temperature or fever. The bandage becomes saturated with blood. Medication does not stop your pain. Injury of the foot occurs. Any symptoms of infection including redness, odor, or red streaks running from wound. 

## 2022-08-06 ENCOUNTER — Other Ambulatory Visit
Admission: RE | Admit: 2022-08-06 | Discharge: 2022-08-06 | Disposition: A | Discharge status: Home / Self Care | Payer: Medicare HMO | Attending: Anesthesiology | Admitting: Anesthesiology

## 2022-08-06 ENCOUNTER — Other Ambulatory Visit: Payer: Self-pay

## 2022-08-06 ENCOUNTER — Ambulatory Visit: Payer: Medicare HMO | Admitting: Anesthesiology

## 2022-08-06 ENCOUNTER — Encounter: Payer: Self-pay | Admitting: Podiatry

## 2022-08-06 ENCOUNTER — Encounter
Admission: RE | Disposition: A | Discharge status: Home / Self Care | Payer: Self-pay | Source: Home / Self Care | Attending: Podiatry

## 2022-08-06 ENCOUNTER — Ambulatory Visit (AMBULATORY_SURGERY_CENTER): Payer: Medicare HMO | Admitting: Anesthesiology

## 2022-08-06 ENCOUNTER — Ambulatory Visit
Admission: RE | Admit: 2022-08-06 | Discharge: 2022-08-06 | Disposition: A | Discharge status: Home / Self Care | Payer: Medicare HMO | Attending: Podiatry | Admitting: Podiatry

## 2022-08-06 DIAGNOSIS — E785 Hyperlipidemia, unspecified: Secondary | ICD-10-CM | POA: Diagnosis not present

## 2022-08-06 DIAGNOSIS — M7989 Other specified soft tissue disorders: Secondary | ICD-10-CM

## 2022-08-06 DIAGNOSIS — M792 Neuralgia and neuritis, unspecified: Secondary | ICD-10-CM | POA: Diagnosis not present

## 2022-08-06 DIAGNOSIS — F418 Other specified anxiety disorders: Secondary | ICD-10-CM | POA: Insufficient documentation

## 2022-08-06 DIAGNOSIS — M67471 Ganglion, right ankle and foot: Secondary | ICD-10-CM | POA: Insufficient documentation

## 2022-08-06 DIAGNOSIS — I10 Essential (primary) hypertension: Secondary | ICD-10-CM | POA: Diagnosis not present

## 2022-08-06 DIAGNOSIS — K219 Gastro-esophageal reflux disease without esophagitis: Secondary | ICD-10-CM | POA: Insufficient documentation

## 2022-08-06 DIAGNOSIS — Z79899 Other long term (current) drug therapy: Secondary | ICD-10-CM | POA: Insufficient documentation

## 2022-08-06 DIAGNOSIS — M79671 Pain in right foot: Secondary | ICD-10-CM | POA: Diagnosis present

## 2022-08-06 DIAGNOSIS — Z01818 Encounter for other preprocedural examination: Secondary | ICD-10-CM

## 2022-08-06 HISTORY — DX: Presence of external hearing-aid: Z97.4

## 2022-08-06 HISTORY — PX: GANGLION CYST EXCISION: SHX1691

## 2022-08-06 LAB — POTASSIUM: Potassium: 3.6 mmol/L (ref 3.5–5.1)

## 2022-08-06 SURGERY — EXCISION, GANGLION CYST, FOOT
Anesthesia: General | Site: Foot | Laterality: Right

## 2022-08-06 MED ORDER — BUPIVACAINE HCL 0.5 % IJ SOLN
INTRAMUSCULAR | Status: DC | PRN
  Administered 2022-08-06: 10 mL

## 2022-08-06 MED ORDER — OXYCODONE HCL 5 MG/5ML PO SOLN: 5.0000 mg | Freq: Once | ORAL | Status: DC | PRN

## 2022-08-06 MED ORDER — OXYCODONE HCL 5 MG PO TABS: 5.0000 mg | ORAL_TABLET | Freq: Once | ORAL | Status: DC | PRN

## 2022-08-06 MED ORDER — MIDAZOLAM HCL 2 MG/2ML IJ SOLN
INTRAMUSCULAR | Status: DC | PRN
  Administered 2022-08-06 (×2): 1 mg via INTRAVENOUS

## 2022-08-06 MED ORDER — PROPOFOL 500 MG/50ML IV EMUL
INTRAVENOUS | Status: DC | PRN
  Administered 2022-08-06: 120 ug/kg/min via INTRAVENOUS

## 2022-08-06 MED ORDER — CEFAZOLIN SODIUM-DEXTROSE 2-4 GM/100ML-% IV SOLN
2.0000 g | INTRAVENOUS | Status: AC
  Administered 2022-08-06: 2 g via INTRAVENOUS

## 2022-08-06 MED ORDER — FENTANYL CITRATE (PF) 100 MCG/2ML IJ SOLN
INTRAMUSCULAR | Status: DC | PRN
  Administered 2022-08-06 (×2): 25 ug via INTRAVENOUS

## 2022-08-06 MED ORDER — 0.9 % SODIUM CHLORIDE (POUR BTL) OPTIME
TOPICAL | Status: DC | PRN
  Administered 2022-08-06: 125 mL

## 2022-08-06 MED ORDER — PROMETHAZINE HCL 25 MG/ML IJ SOLN: 6.2500 mg | INTRAMUSCULAR | Status: DC | PRN

## 2022-08-06 MED ORDER — LACTATED RINGERS IV SOLN: INTRAVENOUS | Status: DC

## 2022-08-06 MED ORDER — DROPERIDOL 2.5 MG/ML IJ SOLN: 0.6250 mg | Freq: Once | INTRAMUSCULAR | Status: DC | PRN

## 2022-08-06 MED ORDER — ACETAMINOPHEN 10 MG/ML IV SOLN: 1000.0000 mg | Freq: Once | INTRAVENOUS | Status: DC | PRN

## 2022-08-06 MED ORDER — FENTANYL CITRATE PF 50 MCG/ML IJ SOSY: 25.0000 ug | PREFILLED_SYRINGE | INTRAMUSCULAR | Status: DC | PRN

## 2022-08-06 MED ORDER — GLYCOPYRROLATE 0.2 MG/ML IJ SOLN
INTRAMUSCULAR | Status: DC | PRN
  Administered 2022-08-06: .2 mg via INTRAVENOUS

## 2022-08-06 MED ORDER — TRAMADOL HCL 50 MG PO TABS: 50.0000 mg | ORAL_TABLET | Freq: Four times a day (QID) | ORAL | 0 refills | Status: AC | PRN

## 2022-08-06 SURGICAL SUPPLY — 26 items
APL SKNCLS STERI-STRIP NONHPOA (GAUZE/BANDAGES/DRESSINGS)
BENZOIN TINCTURE PRP APPL 2/3 (GAUZE/BANDAGES/DRESSINGS) ×1 IMPLANT
BNDG CMPR 75X41 PLY HI ABS (GAUZE/BANDAGES/DRESSINGS) ×1
BNDG CMPR STD VLCR NS LF 5.8X4 (GAUZE/BANDAGES/DRESSINGS) ×1
BNDG ELASTIC 4X5.8 VLCR NS LF (GAUZE/BANDAGES/DRESSINGS) ×1 IMPLANT
BNDG ESMARK 4X12 TAN STRL LF (GAUZE/BANDAGES/DRESSINGS) ×1 IMPLANT
BNDG STRETCH 4X75 STRL LF (GAUZE/BANDAGES/DRESSINGS) ×1 IMPLANT
CANISTER SUCT 1200ML W/VALVE (MISCELLANEOUS) ×1 IMPLANT
COVER LIGHT HANDLE UNIVERSAL (MISCELLANEOUS) IMPLANT
CUFF TOURN SGL QUICK 18X4 (TOURNIQUET CUFF) IMPLANT
DURAPREP 26ML APPLICATOR (WOUND CARE) ×1 IMPLANT
ELECT REM PT RETURN 9FT ADLT (ELECTROSURGICAL) ×1
ELECTRODE REM PT RTRN 9FT ADLT (ELECTROSURGICAL) ×1 IMPLANT
GAUZE SPONGE 4X4 12PLY STRL (GAUZE/BANDAGES/DRESSINGS) ×1 IMPLANT
GAUZE XEROFORM 1X8 LF (GAUZE/BANDAGES/DRESSINGS) ×1 IMPLANT
GLOVE BIOGEL PI IND STRL 7.5 (GLOVE) ×1 IMPLANT
GLOVE SURG POLYISO LF SZ7 (GLOVE) ×2 IMPLANT
GLOVE SURG SS PI 7.0 STRL IVOR (GLOVE) ×1 IMPLANT
GOWN STRL REUS W/ TWL LRG LVL3 (GOWN DISPOSABLE) ×2 IMPLANT
GOWN STRL REUS W/TWL LRG LVL3 (GOWN DISPOSABLE) ×2
KIT TURNOVER KIT A (KITS) ×1 IMPLANT
NS IRRIG 500ML POUR BTL (IV SOLUTION) ×1 IMPLANT
PACK EXTREMITY ARMC (MISCELLANEOUS) ×1 IMPLANT
STOCKINETTE IMPERVIOUS LG (DRAPES) ×1 IMPLANT
STRIP CLOSURE SKIN 1/4X4 (GAUZE/BANDAGES/DRESSINGS) ×1 IMPLANT
SUT ETHILON 3-0 (SUTURE) IMPLANT

## 2022-08-06 NOTE — Anesthesia Preprocedure Evaluation (Addendum)
Anesthesia Evaluation  Patient identified by MRN, date of birth, ID band Patient awake    Reviewed: Allergy & Precautions, NPO status , Patient's Chart, lab work & pertinent test results, reviewed documented beta blocker date and time   Airway Mallampati: III  TM Distance: >3 FB Neck ROM: full    Dental no notable dental hx.    Pulmonary neg pulmonary ROS,    Pulmonary exam normal        Cardiovascular Exercise Tolerance: Good hypertension, Pt. on medications Normal cardiovascular exam+ dysrhythmias (PVC)      Neuro/Psych PSYCHIATRIC DISORDERS Anxiety Wears hearing aid in both ears TIA (2017)   GI/Hepatic Neg liver ROS, hiatal hernia, GERD  Controlled,  Endo/Other  negative endocrine ROS  Renal/GU negative Renal ROS  negative genitourinary   Musculoskeletal   Abdominal Normal abdominal exam  (+)   Peds  Hematology negative hematology ROS (+)   Anesthesia Other Findings Past Medical History: No date: Anemia No date: Anxiety No date: Cervicalgia No date: Depression No date: Dysrhythmia No date: GERD (gastroesophageal reflux disease) No date: Headache No date: History of hiatal hernia No date: Hyperlipidemia No date: Hyperlipidemia No date: Hypertension No date: Loss of hearing     Comment:  aids No date: Lumbar disc disease No date: Migraines No date: Neuralgia No date: Post menopausal problems No date: PVC's (premature ventricular contractions) No date: Rosacea 2017: TIA (transient ischemic attack) No date: Vitamin D deficiency No date: Wears hearing aid in both ears  Past Surgical History: No date: ABDOMINAL HYSTERECTOMY No date: BUNIONECTOMY 02/22/2018: CATARACT EXTRACTION W/PHACO; Right     Comment:  Procedure: CATARACT EXTRACTION PHACO AND INTRAOCULAR               LENS PLACEMENT ([place]) RIGHT;  Surgeon: Eulogio Bear,              MD;  Location: [place];  Service:                Ophthalmology;  Laterality: Right; 03/29/2018: CATARACT EXTRACTION W/PHACO; Left     Comment:  Procedure: CATARACT EXTRACTION PHACO AND INTRAOCULAR               LENS PLACEMENT ([place]) LEFT IVA TOPICAL;  Surgeon: Eulogio Bear, MD;  Location: [place];                Service: Ophthalmology;  Laterality: Left; No date: CHOLECYSTECTOMY 06/27/2015: COLONOSCOPY WITH PROPOFOL; N/A     Comment:  Procedure: COLONOSCOPY WITH PROPOFOL;  Surgeon: Manya Silvas, MD;  Location: [facility] ENDOSCOPY;  Service:               Endoscopy;  Laterality: N/A; No date: COLONOSCOPY, ESOPHAGOGASTRODUODENOSCOPY (EGD) AND ESOPHAGEAL  DILATION 06/27/2015: ESOPHAGOGASTRODUODENOSCOPY (EGD) WITH PROPOFOL; N/A     Comment:  Procedure: ESOPHAGOGASTRODUODENOSCOPY (EGD) WITH               PROPOFOL;  Surgeon: Manya Silvas, MD;  Location: [facility]              ENDOSCOPY;  Service: Endoscopy;  Laterality: N/A; 06/27/2015: SAVORY DILATION     Comment:  Procedure: SAVORY DILATION;  Surgeon: Manya Silvas,               MD;  Location: [facility] ENDOSCOPY;  Service: Endoscopy;; No  date: TONSILLECTOMY  BMI    Body Mass Index: 23.41 kg/m      Reproductive/Obstetrics negative OB ROS                            Anesthesia Physical Anesthesia Plan  ASA: 2  Anesthesia Plan: General   Post-op Pain Management: Regional block   Induction: Intravenous  PONV Risk Score and Plan: Propofol infusion and TIVA  Airway Management Planned: Natural Airway and Simple Face Mask  Additional Equipment:   Intra-op Plan:   Post-operative Plan:   Informed Consent: I have reviewed the patients History and Physical, chart, labs and discussed the procedure including the risks, benefits and alternatives for the proposed anesthesia with the patient or authorized representative who has indicated his/her understanding and acceptance.     Dental Advisory Given  Plan Discussed  with: Anesthesiologist, CRNA and Surgeon  Anesthesia Plan Comments:        Anesthesia Quick Evaluation

## 2022-08-06 NOTE — H&P (Signed)
HISTORY AND PHYSICAL INTERVAL NOTE:  08/06/2022  10:23 AM  Brittany Phillips  has presented today for surgery, with the diagnosis of M67.471 - Ganglion cyst, right foot.  The various methods of treatment have been discussed with the patient.  No guarantees were given.  After consideration of risks, benefits and other options for treatment, the patient has consented to surgery.  I have reviewed the patients' chart and labs.    PROCEDURE: RIGHT FOOT CYST REMOVAL  A history and physical examination was performed in my office.  The patient was reexamined.  There have been no changes to this history and physical examination.  Brittany Phillips, DPM

## 2022-08-06 NOTE — Op Note (Signed)
PODIATRY / FOOT AND ANKLE SURGERY OPERATIVE REPORT    SURGEON: Caroline More, DPM  PRE-OPERATIVE DIAGNOSIS:  1.  Pain right foot 2.  Soft tissue mass right midfoot  POST-OPERATIVE DIAGNOSIS: Same  PROCEDURE(S): Right foot soft tissue mass resection  HEMOSTASIS: No tourniquet  ANESTHESIA: MAC  ESTIMATED BLOOD LOSS: 10 cc  FINDING(S): 1.  Soft tissue mass present to the tibialis anterior tendon consistent with likely some sort of ganglion cyst or clear fluid benign cyst over the dorsal medial midfoot  PATHOLOGY/SPECIMEN(S): Soft tissue mass right foot  INDICATIONS:   Brittany Phillips is a 81 y.o. female who presents with a painful soft tissue mass to the dorsal medial aspect of the right midfoot.  Patient has had this aspirated but it continues to recur and cause pain discomfort with shoes that press against the area.  All treatment options were discussed with the patient both conservative and surgical attempts at correction include potential risks and complications and at this time patient is elected for surgical procedure consisting of right foot cyst resection.  PCP clearance has been obtained prior to procedure.  No guarantees given.  Consent obtained prior to procedure.  Discussed recurrence rate of cystic formation.  DESCRIPTION: After obtaining full informed written consent, the patient was brought back to the operating room and placed supine upon the operating table.  The patient received IV antibiotics prior to induction.  After obtaining adequate anesthesia, the patient was prepped and draped in the standard fashion.  10 cc of half percent Marcaine plain was injected in a V-type block around the cyst over the dorsal medial aspect of the right foot.  An incision was marked out over the area ellipsing a small section of skin over the area.  The skin incision was then made and a small piece of skin was ellipsed and passed off in the operative site over the dorsal medial aspect of  the right foot near the tibialis anterior tendon.  At this time careful dissection was performed around the soft tissue mass keeping care not to puncture the mass.  Once circumferential dissection was performed it appeared to be coming off of the tibialis anterior tendon.  At this time the Bovie was used to cauterize this area and the soft tissue mass was resected and passed off the operative site and sent to pathology for further assessment.  A mild amount of bleeding was occurring during this time so cautery was used to ligate any bleeding vessels.  The area was inspected further for any further residual soft tissue mass and none was identified.  The surgical site was flushed with copious amounts normal sterile saline.  The skin was then reapproximated well coapted with 3-0 nylon in a combination of simple horizontal mattress type stitching.  A postoperative dressing was applied consisting of Xeroform to the incision followed by 4 x 4 gauze, Kerlix, Ace wrap.  The patient tolerated the procedure and anesthesia well was transferred to recovery room with vital signs stable and vascular status intact to all toes of the right foot.  Patient was placed in a postoperative shoe and may be weightbearing as tolerated.  We will plan on closely monitoring incision healing in outpatient clinic.  Discussed with family postoperatively.  COMPLICATIONS: None  CONDITION: Good, stable  Caroline More, DPM

## 2022-08-06 NOTE — Anesthesia Postprocedure Evaluation (Signed)
Anesthesia Post Note  Patient: Brittany Phillips  Procedure(s) Performed: REMOVAL GANGLION CYST FOOT (Right: Foot)     Patient location during evaluation: PACU Anesthesia Type: General Level of consciousness: awake and alert Pain management: pain level controlled Vital Signs Assessment: post-procedure vital signs reviewed and stable Respiratory status: spontaneous breathing, nonlabored ventilation and respiratory function stable Cardiovascular status: blood pressure returned to baseline and stable Postop Assessment: no apparent nausea or vomiting Anesthetic complications: no   There were no known notable events for this encounter.  [place] Ouch

## 2022-08-06 NOTE — Anesthesia Procedure Notes (Signed)
Procedure Name: MAC Date/Time: 08/06/2022 10:49 AM  Performed by: Patience Musca., CRNAPre-anesthesia Checklist: Patient identified, Emergency Drugs available, Suction available, Patient being monitored and Timeout performed Patient Re-evaluated:Patient Re-evaluated prior to induction Oxygen Delivery Method: Nasal cannula

## 2022-08-06 NOTE — Transfer of Care (Signed)
Immediate Anesthesia Transfer of Care Note  Patient: Brittany Phillips  Procedure(s) Performed: REMOVAL GANGLION CYST FOOT (Right: Foot)  Patient Location: PACU  Anesthesia Type: General  Level of Consciousness: awake, alert  and patient cooperative  Airway and Oxygen Therapy: Patient Spontanous Breathing and Patient connected to supplemental oxygen  Post-op Assessment: Post-op Vital signs reviewed, Patient's Cardiovascular Status Stable, Respiratory Function Stable, Patent Airway and No signs of Nausea or vomiting  Post-op Vital Signs: Reviewed and stable  Complications: There were no known notable events for this encounter.

## 2022-08-07 ENCOUNTER — Encounter: Payer: Self-pay | Admitting: Podiatry

## 2022-08-07 LAB — SURGICAL PATHOLOGY

## 2022-08-29 ENCOUNTER — Encounter (HOSPITAL_COMMUNITY): Payer: Self-pay | Admitting: Emergency Medicine

## 2022-08-29 ENCOUNTER — Other Ambulatory Visit: Payer: Self-pay

## 2022-08-29 ENCOUNTER — Emergency Department (HOSPITAL_COMMUNITY)
Admission: EM | Admit: 2022-08-29 | Discharge: 2022-08-29 | Disposition: A | Discharge status: Home / Self Care | Payer: Medicare HMO | Attending: Emergency Medicine | Admitting: Emergency Medicine

## 2022-08-29 DIAGNOSIS — W01198A Fall on same level from slipping, tripping and stumbling with subsequent striking against other object, initial encounter: Secondary | ICD-10-CM | POA: Insufficient documentation

## 2022-08-29 DIAGNOSIS — Z7982 Long term (current) use of aspirin: Secondary | ICD-10-CM | POA: Insufficient documentation

## 2022-08-29 DIAGNOSIS — S8012XA Contusion of left lower leg, initial encounter: Secondary | ICD-10-CM

## 2022-08-29 DIAGNOSIS — S8992XA Unspecified injury of left lower leg, initial encounter: Secondary | ICD-10-CM | POA: Diagnosis present

## 2022-08-29 DIAGNOSIS — T148XXA Other injury of unspecified body region, initial encounter: Secondary | ICD-10-CM

## 2022-08-29 MED ORDER — CEPHALEXIN 500 MG PO CAPS: 500.0000 mg | ORAL_CAPSULE | Freq: Two times a day (BID) | ORAL | 0 refills | Status: AC

## 2022-08-29 NOTE — Discharge Instructions (Addendum)
You have a hematoma or collection of blood on your left lower leg.  This can take many days or weeks to resolve.  You can continue to use pressure bandages at home as needed, and try to keep your leg up and elevated is much as possible.  You can apply ice or frozen peas for 10 minutes at a time, on and off, for the next 2 days.  You continue taking Tylenol for pain regularly, and the tramadol you have at home for more severe pain.  You will need to follow-up with a general surgeon for this issue.  Sometimes hematomas need to be surgically removed.  This is a decision that will be made by the surgeon.  I decided to start you on 5 days of an antibiotic called cephalexin.  This is to treat for possible cellulitis.  It is not clear that you have cellulitis, as the skin redness can also be caused by bruising and blood products from your wound.  However, because your pain is getting worse, and the redness is moving up the leg, I thought it was reasonable to treat with antibiotics.

## 2022-08-29 NOTE — ED Triage Notes (Signed)
Pt reports falling on Wednesday of this week and "I think I hit my left leg on the bumper of the truck as I went down." Pt reports the pain in her left has gotten worse since Wednesday.

## 2022-08-29 NOTE — ED Provider Notes (Signed)
[facility] Provider Note   CSN: [id number] Arrival date & time: 08/29/22  [id number]     History  Chief Complaint  Patient presents with   Lytle Michaels    Brittany Phillips is a 81 y.o. female presenting to emergency department with pain in her left lower leg.  The patient reports that she tripped and fell striking her left leg on a bumper 2 days ago on Wednesday.  She developed a big blood blister on her lower leg after that.  Is been painful.  She went to see her doctor and says that she has had x-rays done in the past 2 days to ensure nothing is broken in her leg.  She presents to the ER today because she woke up and the pain was significantly worse.  The pain is radiating down her lower leg.  It is worse with bearing weight.  Since arriving in the ED and sitting down she feels the pain has essentially gone away.  She is not on blood thinners, but she does report she bruises extremely easily.  HPI     Home Medications Prior to Admission medications   Medication Sig Start Date End Date Taking? Authorizing Provider  cephALEXin (KEFLEX) 500 MG capsule Take 1 capsule (500 mg total) by mouth 2 (two) times daily for 5 days. 08/29/22 09/03/22 Yes Wyvonnia Dusky, MD  aspirin EC 81 MG tablet Take 81 mg by mouth at bedtime.    [provider]  aspirin-sod bicarb-citric acid (ALKA-SELTZER) 325 MG TBEF tablet Take 650 mg by mouth every 6 (six) hours as needed (for indigestion). Patient not taking: Reported on 08/03/2022    [provider]  butalbital-acetaminophen-caffeine (FIORICET, ESGIC) 50-325-40 MG tablet Take 1 tablet by mouth 2 (two) times daily as needed for migraine. Patient not taking: Reported on 08/03/2022    [provider]  Cholecalciferol (VITAMIN D3) 2000 units TABS Take by mouth.    [provider]  clonazePAM (KLONOPIN) 0.5 MG tablet Take 0.25 mg by mouth 2 (two) times daily as needed for anxiety.    [provider]   etodolac (LODINE) 400 MG tablet Take 400 mg by mouth 2 (two) times daily as needed for mild pain.  Patient not taking: Reported on 08/03/2022    [provider]  fluticasone (VERAMYST) 27.5 MCG/SPRAY nasal spray Place 2 sprays into both nostrils at bedtime.     [provider]  gabapentin (NEURONTIN) 100 MG capsule Take 200 mg by mouth 2 (two) times daily.    [provider]  levothyroxine (SYNTHROID, LEVOTHROID) 75 MCG tablet Take 75 mcg by mouth daily before breakfast. Alternate 1/2 tab with full tab    [provider]  losartan-hydrochlorothiazide (HYZAAR) 50-12.5 MG tablet Take 1 tablet by mouth daily.    [provider]  omeprazole (PRILOSEC) 20 MG capsule Take 20 mg by mouth 2 (two) times daily before a meal.     [provider]  propranolol (INDERAL) 40 MG tablet Take 40 mg by mouth 2 (two) times daily. 2 tabs AM, 1 tab PM    [provider]  traMADol (ULTRAM) 50 MG tablet Take 25 mg by mouth daily.    [provider]  vitamin B-12 (CYANOCOBALAMIN) 1000 MCG tablet Take 1,000 mcg by mouth daily. Patient not taking: Reported on 08/03/2022    [provider]      Allergies    Codeine and Tizanidine    Review of Systems   Review  of Systems  Physical Exam Updated Vital Signs BP (!) 149/81 (BP Location: Left Arm)   Pulse 64   Temp 98.3 F (36.8 C) (Oral)   Resp 18   SpO2 100%  Physical Exam Constitutional:      General: She is not in acute distress. HENT:     Head: Normocephalic and atraumatic.  Eyes:     Conjunctiva/sclera: Conjunctivae normal.     Pupils: Pupils are equal, round, and reactive to light.  Cardiovascular:     Rate and Rhythm: Normal rate and regular rhythm.  Pulmonary:     Effort: Pulmonary effort is normal. No respiratory distress.  Skin:    General: Skin is warm and dry.     Comments: Hematoma with overlying blister of the left lower extremity, ecchymoses of the left lower  extremity, see photo  Neurological:     General: No focal deficit present.     Mental Status: She is alert. Mental status is at baseline.  Psychiatric:        Mood and Affect: Mood normal.        Behavior: Behavior normal.      ED Results / Procedures / Treatments   Labs (all labs ordered are listed, but only abnormal results are displayed) Labs Reviewed - No data to display  EKG None  Radiology No results found.  Procedures Procedures    Medications Ordered in ED Medications - No data to display  ED Course/ Medical Decision Making/ A&P                           Medical Decision Making Risk Prescription drug management.   Patient is here with pain in her left lower leg.  I suspect this most likely related to an underlying hematoma beneath this blood blister.  She has ecchymoses and redness of the lower extremity which is likely related to the hematoma as well.  She tends to bruise very easily.  She is already had x-ray films performed after her fall and reports there is no fracture, so I do not see an indication to repeat films at this time.  She is able to bear full weight on her leg.  I have a low suspicion for DVT in this clinical setting.  Her leg compartments are soft, and I do not suspect a an acute compartment syndrome.  I do think is reasonable to start her on a 5-day course of antibiotics for possible cellulitis.  This is due to the fact that her pain is worsened today, acutely, and also due to the fact that the redness is also increasing up the leg.  I explained to them to still not clear that this is an infection, and I am hesitant to diagnose it this way, but antibiotics would be reasonable for 5 days as a preventative measure.  She can also apply compression to the blister, keep it elevated at home.  I will provide an office number for a general surgeon for referral; if she has a persistent hematoma it may need to be evacuated in the office.  But given her  propensity for bleeding, I would prefer this to be handled by specialist.  She and her husband verbalized understanding        Final Clinical Impression(s) / ED Diagnoses Final diagnoses:  Hematoma  Traumatic ecchymosis of left lower leg, initial encounter    Rx / DC Orders ED Discharge Orders  Ordered    cephALEXin (KEFLEX) 500 MG capsule  2 times daily        08/29/22 1044              Wyvonnia Dusky, MD 08/29/22 1114

## 2022-09-01 ENCOUNTER — Ambulatory Visit: Payer: Medicare HMO | Admitting: Surgery

## 2022-09-01 ENCOUNTER — Encounter: Payer: Self-pay | Admitting: Surgery

## 2022-09-01 VITALS — BP 125/75 | HR 62 | Temp 98.2°F | Resp 12 | Ht 62.0 in | Wt 129.0 lb

## 2022-09-01 DIAGNOSIS — S8012XA Contusion of left lower leg, initial encounter: Secondary | ICD-10-CM | POA: Diagnosis not present

## 2022-09-01 NOTE — Progress Notes (Signed)
[facility] History and Physical  Reason for Referral:*** Referring Physician: ***  Chief Complaint   New Patient (Initial Visit)     Brittany Phillips is a 81 y.o. female.  HPI: ***.  The *** started *** and has had a duration of ***.  It is associated with ***.  The *** is improved with ***, and is made worse with ***.    Quality*** Context***  Past Medical History:  Diagnosis Date  . Anemia   . Anxiety   . Cervicalgia   . Depression   . Dysrhythmia   . GERD (gastroesophageal reflux disease)   . Headache   . History of hiatal hernia   . Hyperlipidemia   . Hyperlipidemia   . Hypertension   . Loss of hearing    aids  . Lumbar disc disease   . Migraines   . Neuralgia   . Post menopausal problems   . PVC's (premature ventricular contractions)   . Rosacea   . TIA (transient ischemic attack) 2017  . Vitamin D deficiency   . Wears hearing aid in both ears     Past Surgical History:  Procedure Laterality Date  . ABDOMINAL HYSTERECTOMY    . BUNIONECTOMY    . CATARACT EXTRACTION W/PHACO Right 02/22/2018   Procedure: CATARACT EXTRACTION PHACO AND INTRAOCULAR LENS PLACEMENT (IOC) RIGHT;  Surgeon: Eulogio Bear, MD;  Location: [facility];  Service: Ophthalmology;  Laterality: Right;  . CATARACT EXTRACTION W/PHACO Left 03/29/2018   Procedure: CATARACT EXTRACTION PHACO AND INTRAOCULAR LENS PLACEMENT ([facility]) LEFT IVA TOPICAL;  Surgeon: Eulogio Bear, MD;  Location: [facility];  Service: Ophthalmology;  Laterality: Left;  . CHOLECYSTECTOMY    . COLONOSCOPY WITH PROPOFOL N/A 06/27/2015   Procedure: COLONOSCOPY WITH PROPOFOL;  Surgeon: Manya Silvas, MD;  Location: [facility] ENDOSCOPY;  Service: Endoscopy;  Laterality: N/A;  . COLONOSCOPY, ESOPHAGOGASTRODUODENOSCOPY (EGD) AND ESOPHAGEAL DILATION    . ESOPHAGOGASTRODUODENOSCOPY (EGD) WITH PROPOFOL N/A 06/27/2015   Procedure: ESOPHAGOGASTRODUODENOSCOPY (EGD) WITH PROPOFOL;  Surgeon: Manya Silvas, MD;  Location[facility] ENDOSCOPY;  Service: Endoscopy;  Laterality: N/A;  . GANGLION CYST EXCISION Right 08/06/2022   Procedure: REMOVAL GANGLION CYST FOOT;  Surgeon: Caroline More, DPM;  Location: [facility];  Service: Podiatry;  Laterality: Right;  Anesthesia: MAC w/ Local  . SAVORY DILATION  06/27/2015   Procedure: SAVORY DILATION;  Surgeon: Manya Silvas, MD;  Location: [facility] ENDOSCOPY;  Service: Endoscopy;;  . TONSILLECTOMY      Family History  Problem Relation Age of Onset  . Breast cancer Neg Hx     Social History   Tobacco Use  . Smoking status: Never  . Smokeless tobacco: Never  Vaping Use  . Vaping Use: Never used  Substance Use Topics  . Alcohol use: No  . Drug use: No    Medications: {medication reviewed/display:3041432} Allergies as of 09/01/2022       Reactions   Codeine Other (See Comments)   Reaction:  Weakness    Tizanidine Itching        Medication List        Accurate as of September 01, 2022  1:33 PM. If you have any questions, ask your nurse or doctor.          aspirin EC 81 MG tablet Take 81 mg by mouth at bedtime.   aspirin-sod bicarb-citric acid 325 MG Tbef tablet Commonly known as: ALKA-SELTZER Take 650 mg by mouth every 6 (six) hours as needed (  for indigestion).   butalbital-acetaminophen-caffeine 50-325-40 MG tablet Commonly known as: FIORICET Take 1 tablet by mouth 2 (two) times daily as needed for migraine.   cephALEXin 500 MG capsule Commonly known as: KEFLEX Take 1 capsule (500 mg total) by mouth 2 (two) times daily for 5 days.   clonazePAM 0.5 MG tablet Commonly known as: KLONOPIN Take 0.25 mg by mouth 2 (two) times daily as needed for anxiety.   cyanocobalamin 1000 MCG tablet Commonly known as: VITAMIN B12 Take 1,000 mcg by mouth daily.   etodolac 400 MG tablet Commonly known as: LODINE Take 400 mg by mouth 2 (two) times daily as needed for mild pain.   fluticasone 27.5 MCG/SPRAY nasal  spray Commonly known as: VERAMYST Place 2 sprays into both nostrils at bedtime.   gabapentin 100 MG capsule Commonly known as: NEURONTIN Take 200 mg by mouth 2 (two) times daily.   levothyroxine 75 MCG tablet Commonly known as: SYNTHROID Take 75 mcg by mouth daily before breakfast. Alternate 1/2 tab with full tab   losartan-hydrochlorothiazide 50-12.5 MG tablet Commonly known as: HYZAAR Take 1 tablet by mouth daily.   omeprazole 20 MG capsule Commonly known as: PRILOSEC Take 20 mg by mouth 2 (two) times daily before a meal.   propranolol 40 MG tablet Commonly known as: INDERAL Take 40 mg by mouth 2 (two) times daily. 2 tabs AM, 1 tab PM   traMADol 50 MG tablet Commonly known as: ULTRAM Take 25 mg by mouth daily.   Vitamin D3 50 MCG (2000 UT) Tabs Take by mouth.         ROS:  {Review of Systems:30496}  Blood pressure 125/75, pulse 62, temperature 98.2 F (36.8 C), temperature source Oral, resp. rate 12, height _0  (1.575 m), weight 129 lb (58.5 kg), SpO2 94 %. Physical Exam  Results: No results found for this or any previous visit (from the past 48 hour(s)).  No results found.   Assessment & Plan:  Brittany Phillips is a 81 y.o. female with *** -*** -*** -Follow up ***  All questions were answered to the satisfaction of the patient and family***.  The risk and benefits of *** were discussed including but not limited to ***.  After careful consideration, Brittany Phillips has decided to ***.   Graciella Freer, DO [facility] [address] [place], [place] [address] [phone] (office)

## 2022-09-15 ENCOUNTER — Other Ambulatory Visit: Payer: Self-pay

## 2022-09-15 ENCOUNTER — Ambulatory Visit: Payer: Medicare HMO | Admitting: Surgery

## 2022-09-15 ENCOUNTER — Encounter: Payer: Self-pay | Admitting: Surgery

## 2022-09-15 VITALS — BP 112/74 | HR 64 | Temp 98.2°F | Resp 16 | Ht 62.0 in | Wt 130.0 lb

## 2022-09-15 DIAGNOSIS — S8012XD Contusion of left lower leg, subsequent encounter: Secondary | ICD-10-CM | POA: Diagnosis not present

## 2022-09-15 NOTE — Progress Notes (Signed)
[facility] Note   HPI:  81 y.o. Female presents to clinic for follow-up evaluation of her left lower extremity hematoma.  The area has overall decreased in size, but she has noted some weeping from the wound.  Some of her skin overlying the hematoma is starting to slough off.  She still has pain associated with it, but it is improved.  She denies any fevers or chills.  She does continue to have pressure in that leg.  Review of Systems:  All other review of systems: otherwise negative   Vital Signs:  BP 112/74   Pulse 64   Temp 98.2 F (36.8 C) (Oral)   Resp 16   Ht 5\' 2"  (1.575 m)   Wt 130 lb (59 kg)   SpO2 98%   BMI 23.78 kg/m    Physical Exam:  Physical Exam Vitals reviewed.  Constitutional:      Appearance: Normal appearance.  Skin:    Comments: Left lower extremity with large bulla with underlying clotted blood, measuring roughly 12 x 7 cm, mild tenderness to palpation, superficial epidermis sloughing at the superior aspect of the bulla, small punctate opening at inferior aspect with old blood leaking out  Neurological:     Mental Status: She is alert.    Laboratory studies: None   Imaging:  None   Assessment:  82 y.o. yo Female who presents for follow-up evaluation of her left lower extremity hematoma.  Plan:  -I explained to the patient that given her skin is sloughing at this time, I recommend that we take her to the operating room and do bride this hematoma.  She understands that she will have a wound postoperatively, but I want to get control of this area and decrease risk of the hematoma becoming infected - The risk and benefits of left lower extremity hematoma debridement were discussed including but not limited to bleeding, infection, need for additional procedures, and poor wound healing.  After careful consideration, Liliya Fullenwider has decided to proceed with debridement of her left lower extremity hematoma.  -Patient tentatively scheduled for  surgery on 11/6 -Continue keeping the area covered with daily dressing changes until surgery -Hold aspirin until surgery, in the event this area opens up to decrease risk of bleeding  All of the above recommendations were discussed with the patient, and all of patient's questions were answered to her expressed satisfaction.  Graciella Freer, DO [facility] [address] [place], [place] [address] [phone] (office)

## 2022-09-15 NOTE — H&P (Signed)
[facility] History and Physical   Reason for Referral: Left lower extremity hematoma Referring Physician: ED referral   Chief Complaint   New Patient (Initial Visit)        Brittany Phillips is a 81 y.o. female.  HPI: Patient presents for evaluation of a left lower extremity hematoma.  She states that she has been more unsteady on her feet over the last few weeks, and last Wednesday, she fell and hit her leg on a car bumper.  She was initially doing okay, but subsequently developed worsening pain at her leg, which resulted in her presenting to the emergency department on 10/14.  At that time, she was noted to have a large hematoma of her left lower extremity with an associated bulla.  They were unsure whether the erythema was related to ecchymosis versus an underlying cellulitis, though her blood work was within normal limits.  She was discharged home with a prescription for antibiotic, and advised to follow-up with general surgery to see if more interventions were necessary.  She states that the fluid collection has been decreasing in size.  Her pain is much better, though she does have pressure in her left lower extremity when she attempts to walk.  She does use a walker to get around, but has had more difficulty recently.  She has been wrapping her leg in the evening to prevent this bulla from rupturing.  Her past surgical history is significant for pinched nerves in her back, hypertension, anxiety, GERD, and hypothyroidism.  Her surgical history is significant for tonsillectomy, cholecystectomy, and ganglion cyst excision on her right foot recently.  She denies use of tobacco products, alcohol, and illicit drugs.  She takes an 81 mg aspirin daily.  At follow up, The area has overall decreased in size, but she has noted some weeping from the wound.  Some of her skin overlying the hematoma is starting to slough off.  She still has pain associated with it, but it is improved.  She  denies any fevers or chills.  She does continue to have pressure in that leg.       Past Medical History:  Diagnosis Date   Anemia     Anxiety     Cervicalgia     Depression     Dysrhythmia     GERD (gastroesophageal reflux disease)     Headache     History of hiatal hernia     Hyperlipidemia     Hyperlipidemia     Hypertension     Loss of hearing      aids   Lumbar disc disease     Migraines     Neuralgia     Post menopausal problems     PVC's (premature ventricular contractions)     Rosacea     TIA (transient ischemic attack) 2017   Vitamin D deficiency     Wears hearing aid in both ears             Past Surgical History:  Procedure Laterality Date   ABDOMINAL HYSTERECTOMY       BUNIONECTOMY       CATARACT EXTRACTION W/PHACO Right 02/22/2018    Procedure: CATARACT EXTRACTION PHACO AND INTRAOCULAR LENS PLACEMENT ([place]) RIGHT;  Surgeon: Eulogio Bear, MD;  Location: [place];  Service: Ophthalmology;  Laterality: Right;   CATARACT EXTRACTION W/PHACO Left 03/29/2018    Procedure: CATARACT EXTRACTION PHACO AND INTRAOCULAR LENS PLACEMENT ([place]) LEFT IVA TOPICAL;  Surgeon: Edison Pace,  Josie Saunders, MD;  Location: [place];  Service: Ophthalmology;  Laterality: Left;   CHOLECYSTECTOMY       COLONOSCOPY WITH PROPOFOL N/A 06/27/2015    Procedure: COLONOSCOPY WITH PROPOFOL;  Surgeon: Manya Silvas, MD;  Location: [facility] ENDOSCOPY;  Service: Endoscopy;  Laterality: N/A;   COLONOSCOPY, ESOPHAGOGASTRODUODENOSCOPY (EGD) AND ESOPHAGEAL DILATION       ESOPHAGOGASTRODUODENOSCOPY (EGD) WITH PROPOFOL N/A 06/27/2015    Procedure: ESOPHAGOGASTRODUODENOSCOPY (EGD) WITH PROPOFOL;  Surgeon: Manya Silvas, MD;  Location: [facility] ENDOSCOPY;  Service: Endoscopy;  Laterality: N/A;   GANGLION CYST EXCISION Right 08/06/2022    Procedure: REMOVAL GANGLION CYST FOOT;  Surgeon: Caroline More, DPM;  Location: [place];  Service: Podiatry;  Laterality: Right;  Anesthesia: MAC w/  Local   SAVORY DILATION   06/27/2015    Procedure: SAVORY DILATION;  Surgeon: Manya Silvas, MD;  Location: [facility] ENDOSCOPY;  Service: Endoscopy;;   TONSILLECTOMY               Family History  Problem Relation Age of Onset   Breast cancer Neg Hx        Social History        Tobacco Use   Smoking status: Never   Smokeless tobacco: Never  Vaping Use   Vaping Use: Never used  Substance Use Topics   Alcohol use: No   Drug use: No      Medications: I have reviewed the patient's current medications. Allergies as of 09/01/2022         Reactions    Codeine Other (See Comments)    Reaction:  Weakness     Tizanidine Itching            Medication List           Accurate as of September 01, 2022  1:33 PM. If you have any questions, ask your nurse or doctor.              aspirin EC 81 MG tablet Take 81 mg by mouth at bedtime.    aspirin-sod bicarb-citric acid 325 MG Tbef tablet Commonly known as: ALKA-SELTZER Take 650 mg by mouth every 6 (six) hours as needed (for indigestion).    butalbital-acetaminophen-caffeine 50-325-40 MG tablet Commonly known as: FIORICET Take 1 tablet by mouth 2 (two) times daily as needed for migraine.    cephALEXin 500 MG capsule Commonly known as: KEFLEX Take 1 capsule (500 mg total) by mouth 2 (two) times daily for 5 days.    clonazePAM 0.5 MG tablet Commonly known as: KLONOPIN Take 0.25 mg by mouth 2 (two) times daily as needed for anxiety.    cyanocobalamin 1000 MCG tablet Commonly known as: VITAMIN B12 Take 1,000 mcg by mouth daily.    etodolac 400 MG tablet Commonly known as: LODINE Take 400 mg by mouth 2 (two) times daily as needed for mild pain.    fluticasone 27.5 MCG/SPRAY nasal spray Commonly known as: VERAMYST Place 2 sprays into both nostrils at bedtime.    gabapentin 100 MG capsule Commonly known as: NEURONTIN Take 200 mg by mouth 2 (two) times daily.    levothyroxine 75 MCG tablet Commonly known as:  SYNTHROID Take 75 mcg by mouth daily before breakfast. Alternate 1/2 tab with full tab    losartan-hydrochlorothiazide 50-12.5 MG tablet Commonly known as: HYZAAR Take 1 tablet by mouth daily.    omeprazole 20 MG capsule Commonly known as: PRILOSEC Take 20 mg by mouth 2 (two) times daily before  a meal.    propranolol 40 MG tablet Commonly known as: INDERAL Take 40 mg by mouth 2 (two) times daily. 2 tabs AM, 1 tab PM    traMADol 50 MG tablet Commonly known as: ULTRAM Take 25 mg by mouth daily.    Vitamin D3 50 MCG (2000 UT) Tabs Take by mouth.               ROS:  Constitutional: negative for chills, fatigue, and fevers Eyes: negative for visual disturbance and pain Ears, nose, mouth, throat, and face: negative for ear drainage, sore throat, and sinus problems Respiratory: negative for cough, wheezing, and shortness of breath Cardiovascular: negative for chest pain and palpitations Gastrointestinal: positive for abdominal pain, nausea, and vomiting Genitourinary:negative for dysuria, frequency, and urinary retention Integument/breast: positive for dryness, negative for rash Hematologic/lymphatic: negative for bleeding and lymphadenopathy Musculoskeletal:positive for back pain and neck pain Neurological: positive for dizziness Endocrine: negative for temperature intolerance   BP 112/74   Pulse 64   Temp 98.2 F (36.8 C) (Oral)   Resp 16   Ht _0  (1.575 m)   Wt 130 lb (59 kg)   SpO2 98%   BMI 23.78 kg/m   Physical Exam Vitals reviewed.  Constitutional:      Appearance: Normal appearance.  HENT:     Head: Normocephalic and atraumatic.  Eyes:     Extraocular Movements: Extraocular movements intact.     Pupils: Pupils are equal, round, and reactive to light.  Cardiovascular:     Rate and Rhythm: Normal rate and regular rhythm.  Pulmonary:     Effort: Pulmonary effort is normal.     Breath sounds: Normal breath sounds.  Abdominal:     General: There is no  distension.     Palpations: Abdomen is soft.     Tenderness: There is no abdominal tenderness.  Musculoskeletal:        General: Normal range of motion.     Cervical back: Normal range of motion.  Skin:    General: Skin is warm and dry.     Comments: Left lower extremity with large bulla with underlying clotted blood, measuring roughly 12 x 7 cm, mild tenderness to palpation, superficial epidermis sloughing at the superior aspect of the bulla, small punctate opening at inferior aspect with old blood leaking out  Neurological:     General: No focal deficit present.     Mental Status: She is alert and oriented to person, place, and time.  Psychiatric:        Mood and Affect: Mood normal.        Behavior: Behavior normal.        Results: Lab Results Last 48 Hours  No results found for this or any previous visit (from the past 48 hour(s)).     Imaging Results (Last 48 hours)  No results found.       Assessment & Plan:  MAIRE GOVAN is a 81 y.o. female who presents for evaluation of left lower extremity.  -I explained to the patient that given her skin is sloughing at this time, I recommend that we take her to the operating room and do bride this hematoma.  She understands that she will have a wound postoperatively, but I want to get control of this area and decrease risk of the hematoma becoming infected - The risk and benefits of left lower extremity hematoma debridement were discussed including but not limited to bleeding, infection, need for additional procedures,  and poor wound healing.  After careful consideration, Brittany Phillips has decided to proceed with debridement of her left lower extremity hematoma.  -Patient tentatively scheduled for surgery on 11/6 -Continue keeping the area covered with daily dressing changes until surgery -Hold aspirin until surgery, in the event this area opens up to decrease risk of bleeding   All of the above recommendations were discussed with the  patient, and all of patient's questions were answered to her expressed satisfaction.  Graciella Freer, DO [facility] [address] [place], [place] [address] [phone] (office)

## 2022-09-16 ENCOUNTER — Telehealth: Payer: Self-pay | Admitting: *Deleted

## 2022-09-16 NOTE — Patient Instructions (Addendum)
Brittany Phillips  09/16/2022     @PREFPERIOPPHARMACY @   Your procedure is scheduled on  09/21/2022.   Report to [facility] at  0930  A.M.   Call this number if you have problems the morning of surgery:  [phone]  If you experience any cold or flu symptoms such as cough, fever, chills, shortness of breath, etc. between now and your scheduled surgery, please notify us at the above number.   Remember:  Do not eat or drink after midnight.      Take these medicines the morning of surgery with A SIP OF WATER          clonazepam, gabapentin, levothyroxine, protonix, proprnolol, tramadol(if needed).     Do not wear jewelry, make-up or nail polish.  Do not wear lotions, powders, or perfumes, or deodorant.  Do not shave 48 hours prior to surgery.  Men may shave face and neck.  Do not bring valuables to the hospital.  [facility] is not responsible for any belongings or valuables.  Contacts, dentures or bridgework may not be worn into surgery.  Leave your suitcase in the car.  After surgery it may be brought to your room.  For patients admitted to the hospital, discharge time will be determined by your treatment team.  Patients discharged the day of surgery will not be allowed to drive home and must have someone with them for 24 hours.     Special instructions:   DO NOT smoke tobacco or vape for 24 hours before your procedure.  Please read over the following fact sheets that you were given. Coughing and Deep Breathing, Surgical Site Infection Prevention, Anesthesia Post-op Instructions, and Care and Recovery After Surgery       Incision and Drainage, Care After This sheet gives you information about how to care for yourself after your procedure. Your health care provider may also give you more specific instructions. If you have problems or questions, contact your health care provider. What can I expect after the procedure? After the procedure, it is common to  have: Pain or discomfort around the incision site. Blood, fluid, or pus (drainage) from the incision. Redness and firm skin around the incision site. Follow these instructions at home: Medicines Take over-the-counter and prescription medicines only as told by your health care provider. If you were prescribed an antibiotic medicine, use or take it as told by your health care provider. Do not stop using the antibiotic even if you start to feel better. Wound care Follow instructions from your health care provider about how to take care of your wound. Make sure you: Wash your hands with soap and water before and after you change your bandage (dressing). If soap and water are not available, use hand sanitizer. Change your dressing and packing as told by your health care provider. If your dressing is dry or stuck when you try to remove it, moisten or wet the dressing with saline or water so that it can be removed without harming your skin or tissues. If your wound is packed, leave it in place until your health care provider tells you to remove it. To remove the packing, moisten or wet the packing with saline or water so that it can be removed without harming your skin or tissues. Leave stitches (sutures), skin glue, or adhesive strips in place. These skin closures may need to stay in place for 2 weeks or longer. If adhesive strip edges  start to loosen and curl up, you may trim the loose edges. Do not remove adhesive strips completely unless your health care provider tells you to do that. Check your wound every day for signs of infection. Check for: More redness, swelling, or pain. More fluid or blood. Warmth. Pus or a bad smell. If you were sent home with a drain tube in place, follow instructions from your health care provider about: How to empty it. How to care for it at home.  General instructions Rest the affected area. Do not take baths, swim, or use a hot tub until your health care provider  approves. Ask your health care provider if you may take showers. You may only be allowed to take sponge baths. Return to your normal activities as told by your health care provider. Ask your health care provider what activities are safe for you. Your health care provider may put you on activity or lifting restrictions. The incision will continue to drain. It is normal to have some clear or slightly bloody drainage. The amount of drainage should lessen each day. Do not apply any creams, ointments, or liquids unless you have been told to by your health care provider. Keep all follow-up visits as told by your health care provider. This is important. Contact a health care provider if: Your cyst or abscess returns. You have more redness, swelling, or pain around your incision. You have more fluid or blood coming from your incision. Your incision feels warm to the touch. You have pus or a bad smell coming from your incision. You have red streaks above or below the incision site. Get help right away if: You have severe pain or bleeding. You cannot eat or drink without vomiting. You have a fever or chills. You have redness that spreads quickly. You have decreased urine output. You become short of breath. You have chest pain. You cough up blood. The affected area becomes numb or starts to tingle. These symptoms may represent a serious problem that is an emergency. Do not wait to see if the symptoms will go away. Get medical help right away. Call your local emergency services (911 in the [place] Do not drive yourself to the hospital. Summary After this procedure, it is common to have fluid, blood, or pus coming from the surgery site. Follow all home care instructions. You will be told how to take care of your incision, how to check for infection, and how to take medicines. If you were prescribed an antibiotic medicine, take it as told by your health care provider. Do not stop taking the antibiotic  even if you start to feel better. Contact a health care provider if you have increased redness, swelling, or pain around your incision. Get help right away if you have chest pain, you vomit, you cough up blood, or you have shortness of breath. Keep all follow-up visits as told by your health care provider. This is important. This information is not intended to replace advice given to you by your health care provider. Make sure you discuss any questions you have with your health care provider. Document Revised: 02/05/2022 Document Reviewed: 08/14/2021 Elsevier Patient Education  2023 [employer] General Anesthesia, Adult, Care After The following information offers guidance on how to care for yourself after your procedure. Your health care provider may also give you more specific instructions. If you have problems or questions, contact your health care provider. What can I expect after the procedure? After the procedure, it is common  for people to: Have pain or discomfort at the IV site. Have nausea or vomiting. Have a sore throat or hoarseness. Have trouble concentrating. Feel cold or chills. Feel weak, sleepy, or tired (fatigue). Have soreness and body aches. These can affect parts of the body that were not involved in surgery. Follow these instructions at home: For the time period you were told by your health care provider:  Rest. Do not participate in activities where you could fall or become injured. Do not drive or use machinery. Do not drink alcohol. Do not take sleeping pills or medicines that cause drowsiness. Do not make important decisions or sign legal documents. Do not take care of children on your own. General instructions Drink enough fluid to keep your urine pale yellow. If you have sleep apnea, surgery and certain medicines can increase your risk for breathing problems. Follow instructions from your health care provider about wearing your sleep device: Anytime you are  sleeping, including during daytime naps. While taking prescription pain medicines, sleeping medicines, or medicines that make you drowsy. Return to your normal activities as told by your health care provider. Ask your health care provider what activities are safe for you. Take over-the-counter and prescription medicines only as told by your health care provider. Do not use any products that contain nicotine or tobacco. These products include cigarettes, chewing tobacco, and vaping devices, such as e-cigarettes. These can delay incision healing after surgery. If you need help quitting, ask your health care provider. Contact a health care provider if: You have nausea or vomiting that does not get better with medicine. You vomit every time you eat or drink. You have pain that does not get better with medicine. You cannot urinate or have bloody urine. You develop a skin rash. You have a fever. Get help right away if: You have trouble breathing. You have chest pain. You vomit blood. These symptoms may be an emergency. Get help right away. Call 911. Do not wait to see if the symptoms will go away. Do not drive yourself to the hospital. Summary After the procedure, it is common to have a sore throat, hoarseness, nausea, vomiting, or to feel weak, sleepy, or fatigue. For the time period you were told by your health care provider, do not drive or use machinery. Get help right away if you have difficulty breathing, have chest pain, or vomit blood. These symptoms may be an emergency. This information is not intended to replace advice given to you by your health care provider. Make sure you discuss any questions you have with your health care provider. Document Revised: 01/30/2022 Document Reviewed: 01/30/2022 Elsevier Patient Education  2023 Else[employer] Inc. How to Use Chlorhexidine Before Surgery Chlorhexidine gluconate (CHG) is a germ-killing (antiseptic) solution that is used to clean the skin. It  can get rid of the bacteria that normally live on the skin and can keep them away for about 24 hours. To clean your skin with CHG, you may be given: A CHG solution to use in the shower or as part of a sponge bath. A prepackaged cloth that contains CHG. Cleaning your skin with CHG may help lower the risk for infection: While you are staying in the intensive care unit of the hospital. If you have a vascular access, such as a central line, to provide short-term or long-term access to your veins. If you have a catheter to drain urine from your bladder. If you are on a ventilator. A ventilator is a machine that  helps you breathe by moving air in and out of your lungs. After surgery. What are the risks? Risks of using CHG include: A skin reaction. Hearing loss, if CHG gets in your ears and you have a perforated eardrum. Eye injury, if CHG gets in your eyes and is not rinsed out. The CHG product catching fire. Make sure that you avoid smoking and flames after applying CHG to your skin. Do not use CHG: If you have a chlorhexidine allergy or have previously reacted to chlorhexidine. On babies younger than 58 months of age. How to use CHG solution Use CHG only as told by your health care provider, and follow the instructions on the label. Use the full amount of CHG as directed. Usually, this is one bottle. During a shower Follow these steps when using CHG solution during a shower (unless your health care provider gives you different instructions): Start the shower. Use your normal soap and shampoo to wash your face and hair. Turn off the shower or move out of the shower stream. Pour the CHG onto a clean washcloth. Do not use any type of brush or rough-edged sponge. Starting at your neck, lather your body down to your toes. Make sure you follow these instructions: If you will be having surgery, pay special attention to the part of your body where you will be having surgery. Scrub this area for at  least 1 minute. Do not use CHG on your head or face. If the solution gets into your ears or eyes, rinse them well with water. Avoid your genital area. Avoid any areas of skin that have broken skin, cuts, or scrapes. Scrub your back and under your arms. Make sure to wash skin folds. Let the lather sit on your skin for 1-2 minutes or as long as told by your health care provider. Thoroughly rinse your entire body in the shower. Make sure that all body creases and crevices are rinsed well. Dry off with a clean towel. Do not put any substances on your body afterward--such as powder, lotion, or perfume--unless you are told to do so by your health care provider. Only use lotions that are recommended by the manufacturer. Put on clean clothes or pajamas. If it is the night before your surgery, sleep in clean sheets.  During a sponge bath Follow these steps when using CHG solution during a sponge bath (unless your health care provider gives you different instructions): Use your normal soap and shampoo to wash your face and hair. Pour the CHG onto a clean washcloth. Starting at your neck, lather your body down to your toes. Make sure you follow these instructions: If you will be having surgery, pay special attention to the part of your body where you will be having surgery. Scrub this area for at least 1 minute. Do not use CHG on your head or face. If the solution gets into your ears or eyes, rinse them well with water. Avoid your genital area. Avoid any areas of skin that have broken skin, cuts, or scrapes. Scrub your back and under your arms. Make sure to wash skin folds. Let the lather sit on your skin for 1-2 minutes or as long as told by your health care provider. Using a different clean, wet washcloth, thoroughly rinse your entire body. Make sure that all body creases and crevices are rinsed well. Dry off with a clean towel. Do not put any substances on your body afterward--such as powder, lotion,  or perfume--unless you are told  to do so by your health care provider. Only use lotions that are recommended by the manufacturer. Put on clean clothes or pajamas. If it is the night before your surgery, sleep in clean sheets. How to use CHG prepackaged cloths Only use CHG cloths as told by your health care provider, and follow the instructions on the label. Use the CHG cloth on clean, dry skin. Do not use the CHG cloth on your head or face unless your health care provider tells you to. When washing with the CHG cloth: Avoid your genital area. Avoid any areas of skin that have broken skin, cuts, or scrapes. Before surgery Follow these steps when using a CHG cloth to clean before surgery (unless your health care provider gives you different instructions): Using the CHG cloth, vigorously scrub the part of your body where you will be having surgery. Scrub using a back-and-forth motion for 3 minutes. The area on your body should be completely wet with CHG when you are done scrubbing. Do not rinse. Discard the cloth and let the area air-dry. Do not put any substances on the area afterward, such as powder, lotion, or perfume. Put on clean clothes or pajamas. If it is the night before your surgery, sleep in clean sheets.  For general bathing Follow these steps when using CHG cloths for general bathing (unless your health care provider gives you different instructions). Use a separate CHG cloth for each area of your body. Make sure you wash between any folds of skin and between your fingers and toes. Wash your body in the following order, switching to a new cloth after each step: The front of your neck, shoulders, and chest. Both of your arms, under your arms, and your hands. Your stomach and groin area, avoiding the genitals. Your right leg and foot. Your left leg and foot. The back of your neck, your back, and your buttocks. Do not rinse. Discard the cloth and let the area air-dry. Do not put any  substances on your body afterward--such as powder, lotion, or perfume--unless you are told to do so by your health care provider. Only use lotions that are recommended by the manufacturer. Put on clean clothes or pajamas. Contact a health care provider if: Your skin gets irritated after scrubbing. You have questions about using your solution or cloth. You swallow any chlorhexidine. Call your local poison control center [phone] in the [place] Get help right away if: Your eyes itch badly, or they become very red or swollen. Your skin itches badly and is red or swollen. Your hearing changes. You have trouble seeing. You have swelling or tingling in your mouth or throat. You have trouble breathing. These symptoms may represent a serious problem that is an emergency. Do not wait to see if the symptoms will go away. Get medical help right away. Call your local emergency services (911 in the [place] Do not drive yourself to the hospital. Summary Chlorhexidine gluconate (CHG) is a germ-killing (antiseptic) solution that is used to clean the skin. Cleaning your skin with CHG may help to lower your risk for infection. You may be given CHG to use for bathing. It may be in a bottle or in a prepackaged cloth to use on your skin. Carefully follow your health care provider's instructions and the instructions on the product label. Do not use CHG if you have a chlorhexidine allergy. Contact your health care provider if your skin gets irritated after scrubbing. This information is not intended to replace advice  given to you by your health care provider. Make sure you discuss any questions you have with your health care provider. Document Revised: 03/02/2022 Document Reviewed: 01/13/2021 Elsevier Patient Education  2023 [employer].

## 2022-09-16 NOTE — Telephone Encounter (Signed)
Surgical Date: 09/21/2022 Procedure: Debridement, Hematomoa, LLE  Received call from patient [phone]~ telephone.   Patient reports that she is having increased sciatica pain and is scheduled for cortisone injection on 09/16/2022. Inquired if injection will affect pre- op or scheduled surgery.   Discussed with Dr. Okey Dupre. Reports that injection should have no effect on surgery.   Call placed to patient and patient made aware.

## 2022-09-17 ENCOUNTER — Encounter (HOSPITAL_COMMUNITY): Payer: Self-pay

## 2022-09-17 ENCOUNTER — Encounter (HOSPITAL_COMMUNITY)
Admission: RE | Admit: 2022-09-17 | Discharge: 2022-09-17 | Disposition: A | Discharge status: Home / Self Care | Payer: Medicare HMO | Source: Ambulatory Visit | Attending: Surgery | Admitting: Surgery

## 2022-09-17 ENCOUNTER — Other Ambulatory Visit: Payer: Self-pay

## 2022-09-17 VITALS — BP 135/58 | HR 61 | Temp 98.7°F | Resp 18 | Ht 62.0 in | Wt 130.1 lb

## 2022-09-17 DIAGNOSIS — Z01810 Encounter for preprocedural cardiovascular examination: Secondary | ICD-10-CM | POA: Insufficient documentation

## 2022-09-17 DIAGNOSIS — I10 Essential (primary) hypertension: Secondary | ICD-10-CM | POA: Insufficient documentation

## 2022-09-17 HISTORY — DX: Cerebral infarction, unspecified: I63.9

## 2022-09-21 ENCOUNTER — Ambulatory Visit (HOSPITAL_BASED_OUTPATIENT_CLINIC_OR_DEPARTMENT_OTHER): Payer: Medicare HMO | Admitting: Anesthesiology

## 2022-09-21 ENCOUNTER — Ambulatory Visit (HOSPITAL_COMMUNITY): Payer: Medicare HMO | Admitting: Anesthesiology

## 2022-09-21 ENCOUNTER — Encounter (HOSPITAL_COMMUNITY): Payer: Self-pay | Admitting: Surgery

## 2022-09-21 ENCOUNTER — Encounter (HOSPITAL_COMMUNITY)
Admission: RE | Disposition: A | Discharge status: Home / Self Care | Payer: Self-pay | Source: Home / Self Care | Attending: Surgery

## 2022-09-21 ENCOUNTER — Ambulatory Visit (HOSPITAL_COMMUNITY)
Admission: RE | Admit: 2022-09-21 | Discharge: 2022-09-21 | Disposition: A | Discharge status: Home / Self Care | Payer: Medicare HMO | Attending: Surgery | Admitting: Surgery

## 2022-09-21 ENCOUNTER — Other Ambulatory Visit: Payer: Self-pay

## 2022-09-21 ENCOUNTER — Other Ambulatory Visit: Payer: Self-pay | Admitting: Surgery

## 2022-09-21 DIAGNOSIS — K219 Gastro-esophageal reflux disease without esophagitis: Secondary | ICD-10-CM | POA: Diagnosis not present

## 2022-09-21 DIAGNOSIS — G43909 Migraine, unspecified, not intractable, without status migrainosus: Secondary | ICD-10-CM | POA: Diagnosis not present

## 2022-09-21 DIAGNOSIS — E039 Hypothyroidism, unspecified: Secondary | ICD-10-CM | POA: Insufficient documentation

## 2022-09-21 DIAGNOSIS — D638 Anemia in other chronic diseases classified elsewhere: Secondary | ICD-10-CM

## 2022-09-21 DIAGNOSIS — S8012XA Contusion of left lower leg, initial encounter: Secondary | ICD-10-CM | POA: Diagnosis present

## 2022-09-21 DIAGNOSIS — Z7982 Long term (current) use of aspirin: Secondary | ICD-10-CM | POA: Insufficient documentation

## 2022-09-21 DIAGNOSIS — I10 Essential (primary) hypertension: Secondary | ICD-10-CM | POA: Insufficient documentation

## 2022-09-21 DIAGNOSIS — S8011XA Contusion of right lower leg, initial encounter: Secondary | ICD-10-CM | POA: Diagnosis not present

## 2022-09-21 DIAGNOSIS — S8012XD Contusion of left lower leg, subsequent encounter: Secondary | ICD-10-CM | POA: Diagnosis not present

## 2022-09-21 DIAGNOSIS — F32A Depression, unspecified: Secondary | ICD-10-CM | POA: Diagnosis not present

## 2022-09-21 DIAGNOSIS — F419 Anxiety disorder, unspecified: Secondary | ICD-10-CM | POA: Diagnosis not present

## 2022-09-21 DIAGNOSIS — W01198A Fall on same level from slipping, tripping and stumbling with subsequent striking against other object, initial encounter: Secondary | ICD-10-CM | POA: Diagnosis not present

## 2022-09-21 DIAGNOSIS — Z79899 Other long term (current) drug therapy: Secondary | ICD-10-CM | POA: Insufficient documentation

## 2022-09-21 DIAGNOSIS — K449 Diaphragmatic hernia without obstruction or gangrene: Secondary | ICD-10-CM | POA: Insufficient documentation

## 2022-09-21 HISTORY — PX: INCISION AND DRAINAGE OF WOUND: SHX1803

## 2022-09-21 SURGERY — IRRIGATION AND DEBRIDEMENT WOUND
Anesthesia: General | Site: Leg Lower | Laterality: Left

## 2022-09-21 MED ORDER — BUPIVACAINE-EPINEPHRINE (PF) 0.5% -1:200000 IJ SOLN
INTRAMUSCULAR | Status: AC
  Filled 2022-09-21: qty 30

## 2022-09-21 MED ORDER — CHLORHEXIDINE GLUCONATE 0.12 % MT SOLN
15.0000 mL | Freq: Once | OROMUCOSAL | Status: AC
  Administered 2022-09-21: 15 mL via OROMUCOSAL

## 2022-09-21 MED ORDER — OXYCODONE HCL 5 MG PO TABS: 5.0000 mg | ORAL_TABLET | Freq: Four times a day (QID) | ORAL | 0 refills | Status: DC | PRN

## 2022-09-21 MED ORDER — EPHEDRINE 5 MG/ML INJ
INTRAVENOUS | Status: AC
  Filled 2022-09-21: qty 5

## 2022-09-21 MED ORDER — SANTYL 250 UNIT/GM EX OINT: 1.0000 | TOPICAL_OINTMENT | Freq: Every day | CUTANEOUS | 0 refills | Status: DC

## 2022-09-21 MED ORDER — HYDROMORPHONE HCL 1 MG/ML IJ SOLN
0.2500 mg | INTRAMUSCULAR | Status: DC | PRN
  Administered 2022-09-21: 0.5 mg via INTRAVENOUS
  Filled 2022-09-21: qty 0.5

## 2022-09-21 MED ORDER — ACETAMINOPHEN 500 MG PO TABS: 1000.0000 mg | ORAL_TABLET | Freq: Four times a day (QID) | ORAL | 0 refills | Status: AC

## 2022-09-21 MED ORDER — 0.9 % SODIUM CHLORIDE (POUR BTL) OPTIME
TOPICAL | Status: DC | PRN
  Administered 2022-09-21: 1000 mL

## 2022-09-21 MED ORDER — CHLORHEXIDINE GLUCONATE CLOTH 2 % EX PADS: 6.0000 | MEDICATED_PAD | Freq: Once | CUTANEOUS | Status: DC

## 2022-09-21 MED ORDER — FENTANYL CITRATE (PF) 100 MCG/2ML IJ SOLN
INTRAMUSCULAR | Status: AC
  Filled 2022-09-21: qty 2

## 2022-09-21 MED ORDER — PROPOFOL 10 MG/ML IV BOLUS
INTRAVENOUS | Status: DC | PRN
  Administered 2022-09-21: 100 mg via INTRAVENOUS

## 2022-09-21 MED ORDER — SUCCINYLCHOLINE CHLORIDE 200 MG/10ML IV SOSY
PREFILLED_SYRINGE | INTRAVENOUS | Status: AC
  Filled 2022-09-21: qty 10

## 2022-09-21 MED ORDER — LIDOCAINE HCL (CARDIAC) PF 50 MG/5ML IV SOSY
PREFILLED_SYRINGE | INTRAVENOUS | Status: DC | PRN
  Administered 2022-09-21: 60 mg via INTRAVENOUS

## 2022-09-21 MED ORDER — ASPIRIN EC 81 MG PO TBEC: 81.0000 mg | DELAYED_RELEASE_TABLET | Freq: Every day | ORAL | 11 refills | Status: AC

## 2022-09-21 MED ORDER — LACTATED RINGERS IV SOLN
INTRAVENOUS | Status: DC
  Administered 2022-09-21: 1000 mL via INTRAVENOUS

## 2022-09-21 MED ORDER — CEFAZOLIN SODIUM-DEXTROSE 2-4 GM/100ML-% IV SOLN
2.0000 g | INTRAVENOUS | Status: AC
  Administered 2022-09-21: 2 g via INTRAVENOUS
  Filled 2022-09-21: qty 100

## 2022-09-21 MED ORDER — FENTANYL CITRATE (PF) 100 MCG/2ML IJ SOLN
INTRAMUSCULAR | Status: DC | PRN
  Administered 2022-09-21: 50 ug via INTRAVENOUS

## 2022-09-21 MED ORDER — ORAL CARE MOUTH RINSE: 15.0000 mL | Freq: Once | OROMUCOSAL | Status: AC

## 2022-09-21 MED ORDER — ONDANSETRON HCL 4 MG/2ML IJ SOLN
INTRAMUSCULAR | Status: AC
  Filled 2022-09-21: qty 2

## 2022-09-21 MED ORDER — LIDOCAINE HCL (PF) 2 % IJ SOLN
INTRAMUSCULAR | Status: AC
  Filled 2022-09-21: qty 5

## 2022-09-21 MED ORDER — SUCCINYLCHOLINE 20MG/ML (10ML) SYRINGE FOR MEDFUSION PUMP - OPTIME
INTRAMUSCULAR | Status: DC | PRN
  Administered 2022-09-21: 80 mg via INTRAVENOUS

## 2022-09-21 MED ORDER — ONDANSETRON HCL 4 MG/2ML IJ SOLN: 4.0000 mg | Freq: Once | INTRAMUSCULAR | Status: DC | PRN

## 2022-09-21 MED ORDER — COLLAGENASE 250 UNIT/GM EX OINT
TOPICAL_OINTMENT | Freq: Every day | CUTANEOUS | Status: DC
  Administered 2022-09-21: 1 via TOPICAL
  Filled 2022-09-21: qty 30

## 2022-09-21 MED ORDER — BUPIVACAINE HCL (PF) 0.5 % IJ SOLN
INTRAMUSCULAR | Status: AC
  Filled 2022-09-21: qty 30

## 2022-09-21 MED ORDER — MIDAZOLAM HCL 2 MG/2ML IJ SOLN
INTRAMUSCULAR | Status: AC
  Filled 2022-09-21: qty 2

## 2022-09-21 SURGICAL SUPPLY — 20 items
BLADE SURG SZ11 CARB STEEL (BLADE) IMPLANT
BNDG ELASTIC 4X5.8 VLCR STR LF (GAUZE/BANDAGES/DRESSINGS) IMPLANT
BNDG GAUZE ROLL STR 2.25X3YD (GAUZE/BANDAGES/DRESSINGS) IMPLANT
CLOTH BEACON ORANGE TIMEOUT ST (SAFETY) ×1 IMPLANT
COVER LIGHT HANDLE STERIS (MISCELLANEOUS) ×2 IMPLANT
DECANTER SPIKE VIAL GLASS SM (MISCELLANEOUS) IMPLANT
ELECT REM PT RETURN 9FT ADLT (ELECTROSURGICAL) ×1
ELECTRODE REM PT RTRN 9FT ADLT (ELECTROSURGICAL) ×1 IMPLANT
GAUZE SPONGE 4X4 12PLY STRL (GAUZE/BANDAGES/DRESSINGS) ×1 IMPLANT
GLOVE BIOGEL PI IND STRL 6.5 (GLOVE) ×1 IMPLANT
GLOVE BIOGEL PI IND STRL 7.0 (GLOVE) ×2 IMPLANT
GLOVE SURG SS PI 6.5 STRL IVOR (GLOVE) ×1 IMPLANT
GLOVE SURG SS PI 7.0 STRL IVOR (GLOVE) IMPLANT
GOWN STRL REUS W/TWL LRG LVL3 (GOWN DISPOSABLE) ×2 IMPLANT
KIT TURNOVER KIT A (KITS) ×1 IMPLANT
MANIFOLD NEPTUNE II (INSTRUMENTS) ×1 IMPLANT
NS IRRIG 1000ML POUR BTL (IV SOLUTION) ×1 IMPLANT
PACK BASIC LIMB (CUSTOM PROCEDURE TRAY) IMPLANT
PAD ARMBOARD 7.5X6 YLW CONV (MISCELLANEOUS) ×1 IMPLANT
SET BASIN LINEN APH (SET/KITS/TRAYS/PACK) ×1 IMPLANT

## 2022-09-21 NOTE — Anesthesia Procedure Notes (Addendum)
Procedure Name: Intubation Date/Time: 09/21/2022 10:37 AM  Performed by: Ollen Bowl, CRNAPre-anesthesia Checklist: Patient identified, Patient being monitored, Timeout performed, Emergency Drugs available and Suction available Patient Re-evaluated:Patient Re-evaluated prior to induction Oxygen Delivery Method: Circle system utilized Preoxygenation: Pre-oxygenation with 100% oxygen Induction Type: IV induction Ventilation: Mask ventilation without difficulty Laryngoscope Size: Mac and 3 Grade View: Grade I Tube type: Oral Tube size: 7.0 mm Number of attempts: 1 Airway Equipment and Method: Stylet Placement Confirmation: ETT inserted through vocal cords under direct vision, positive ETCO2 and breath sounds checked- equal and bilateral Secured at: 21 cm Tube secured with: Tape Dental Injury: Teeth and Oropharynx as per pre-operative assessment

## 2022-09-21 NOTE — Discharge Instructions (Signed)
Ambulatory Surgery Discharge Instructions  General Anesthesia or Sedation Do not drive or operate heavy machinery for 24 hours.  Do not consume alcohol, tranquilizers, sleeping medications, or any non-prescribed medications for 24 hours. Do not make important decisions or sign any important papers in the next 24 hours. You should have someone with you tonight at home.  Activity  You are advised to go directly home from the hospital.  Restrict your activities and rest for a day.  Resume light activity tomorrow. No heavy lifting over 10 lbs or strenuous exercise.  Fluids and Diet Begin with clear liquids, bouillon, dry toast, soda crackers.  If not nauseated, you may go to a regular diet when you desire.  Greasy and spicy foods are not advised.  Medications  If you have not had a bowel movement in 24 hours, take 2 tablespoons over the counter Milk of mag.             You May resume your blood thinners tomorrow (Aspirin, coumadin, or other).  You are being discharged with prescriptions for Opioid/Narcotic Medications: There are some specific considerations for these medications that you should know. Opioid Meds have risks & benefits. Addiction to these meds is always a concern with prolonged use Take medication only as directed Do not drive while taking narcotic pain medication Do not crush tablets or capsules Do not use a different container than medication was dispensed in Lock the container of medication in a cool, dry place out of reach of children and pets. Opioid medication can cause addiction Do not share with anyone else (this is a felony) Do not store medications for future use. Dispose of them properly.     Disposal:  Find a [employer] household drug take back site near you.  If you can't get to a drug take back site, use the recipe below as a last resort to dispose of expired, unused or unwanted drugs. Disposal  (Do not dispose chemotherapy drugs this way, talk to your  prescribing doctor instead.) Step 1: Mix drugs (do not crush) with dirt, kitty litter, or used coffee grounds and add a small amount of water to dissolve any solid medications. Step 2: Seal drugs in plastic bag. Step 3: Place plastic bag in trash. Step 4: Take prescription container and scratch out personal information, then recycle or throw away.  Operative Site/Wound Care Instructions  Change your dressing daily.  Apply Santyl cream to open wound daily.  Apply cream to be the thickness of a nickel. Cover wound with damp 4x4. Wrap with gauze roll and ace wrap. Ok to [employer] developer. You can let soapy water run over the wound. Keep wound clean and dry. No baths or swimming.   Contact Information: If you have questions or concerns, please call our office, [phone], Monday- Thursday 8AM-5PM and Friday 8AM-12Noon.  If it is after hours or on the weekend, please call [facility] Main Number, [phone], and ask to speak to the surgeon on call for Dr. Okey Dupre at [facility].   SPECIFIC COMPLICATIONS TO WATCH FOR: Inability to urinate Fever over 101? F by mouth Nausea and vomiting lasting longer than 24 hours. Pain not relieved by medication ordered Swelling around the operative site Increased redness, warmth, hardness, around operative area Numbness, tingling, or cold fingers or toes Blood -soaked dressing, (small amounts of oozing may be normal) Increasing and progressive drainage from surgical area or exam site

## 2022-09-21 NOTE — Op Note (Signed)
[facility] Operative Note  09/21/22  Preoperative Diagnosis: Left lower extremity hematoma   Postoperative Diagnosis: Same   Procedure(s) Performed: Debridement of left lower extremity hematoma   Surgeon: Graciella Freer, DO    Assistants: Spero Curb, MS3   Anesthesia: General endotracheal   Anesthesiologist: Denese Killings, MD    Specimens: None   Estimated Blood Loss: Minimal   Blood Replacement: None    Complications: None   Wound Class: Contaminated   Operative Indications: Patient is a 81 year old female who presents for debridement of the left lower extremity hematoma.  She sustained a fall early in October and subsequently developed a hematoma.  We were initially attempting nonoperative management, but her skin has started to slough off, so I recommend debridement at this time to decrease the risk of her developing a wound infection.  All risks and benefits of performing this procedure were discussed with the patient including pain, infection, bleeding, damage to the surrounding structures, and need for more procedures or surgery. The patient voiced understanding of the procedure, all questions were sought and answered, and consent was obtained.  Findings: 10 x 6 cm wound down to subcutaneous fat on the left lower extremity   Procedure: The patient was taken to the operating room and placed supine. General endotracheal anesthesia was induced. Intravenous antibiotics were administered per protocol. The left lower extremity was prepared and draped in the usual sterile fashion.   The patient had a readily apparent left lower extremity wound with overlying hematoma.  A scrub brush was used to gently scrub off the overlying hematoma.  At the superior aspect of the wound, there was some dead tissue noted.  This was debrided down to healthy bleeding subcutaneous fat.  Curette was used to clean off any devitalized tissue and remaining hematoma.  The  wound was copiously irrigated with saline.  Hemostasis was achieved with electrocautery.  The final wound measured 10 x 6 cm.  Santyl was applied to the wound.  This was covered with a damp 4 x 4 and wrapped with Kerlix and Ace wrap.  Final inspection revealed acceptable hemostasis. All counts were correct at the end of the case. The patient was awakened from anesthesia and extubated without complication.  The patient went to the PACU in stable condition.   Graciella Freer, DO  [facility] [address] [place], [place] [address] [phone] (office)

## 2022-09-21 NOTE — Transfer of Care (Signed)
Immediate Anesthesia Transfer of Care Note  Patient: Brittany Phillips  Procedure(s) Performed: IRRIGATION AND DEBRIDEMENT HEMATOMA (Left: Leg Lower)  Patient Location: PACU  Anesthesia Type:General  Level of Consciousness: awake  Airway & Oxygen Therapy: Patient Spontanous Breathing  Post-op Assessment: Report given to RN  Post vital signs: Reviewed  Last Vitals:  Vitals Value Taken Time  BP 150/67 09/21/22 1130  Temp 97.4   Pulse 63 09/21/22 1131  Resp 11 09/21/22 1131  SpO2 97 % 09/21/22 1131  Vitals shown include unvalidated device data.  Last Pain:  Vitals:   09/21/22 0959  TempSrc: Oral  PainSc: 0-No pain      Patients Stated Pain Goal: 7 (31/54/00 8676)  Complications: No notable events documented.

## 2022-09-21 NOTE — Anesthesia Postprocedure Evaluation (Signed)
Anesthesia Post Note  Patient: Brittany Phillips  Procedure(s) Performed: IRRIGATION AND DEBRIDEMENT HEMATOMA (Left: Leg Lower)  Patient location during evaluation: Phase II Anesthesia Type: General Level of consciousness: awake and alert and oriented Pain management: pain level controlled Vital Signs Assessment: post-procedure vital signs reviewed and stable Respiratory status: spontaneous breathing, nonlabored ventilation and respiratory function stable Cardiovascular status: blood pressure returned to baseline and stable Postop Assessment: no apparent nausea or vomiting Anesthetic complications: no  No notable events documented.   Last Vitals:  Vitals:   09/21/22 1145 09/21/22 1200  BP: (!) 147/68 (!) 157/71  Pulse: 60 (!) 54  Resp: 16 (!) 21  Temp:    SpO2: 97% 100%    Last Pain:  Vitals:   09/21/22 1130  TempSrc:   PainSc: Asleep                 Marytza Grandpre C Allannah Kempen

## 2022-09-21 NOTE — Anesthesia Preprocedure Evaluation (Addendum)
Anesthesia Evaluation  Patient identified by MRN, date of birth, ID band Patient awake    Reviewed: Allergy & Precautions, H&P , NPO status , Patient's Chart, lab work & pertinent test results  Airway Mallampati: II  TM Distance: >3 FB Neck ROM: Full   Comment: Cervical disc disorder with radiculopathy Dental  (+) Dental Advisory Given, Missing   Pulmonary neg pulmonary ROS   Pulmonary exam normal breath sounds clear to auscultation       Cardiovascular hypertension, Pt. on medications Normal cardiovascular exam+ dysrhythmias (PVCs)  Rhythm:Regular Rate:Normal  17-Sep-2022 14:21:55 [facility]-OPS ROUTINE RECORD [birth year] (48 yr) Female Caucasian Vent. rate 63 BPM PR interval 182 ms QRS duration 90 ms QT/QTcB 360/368 ms P-R-T axes 72 85 83 Normal sinus rhythm Cannot rule out Anterior infarct , age undetermined Borderline ECG When compared with ECG of 17-Sep-2022 14:19, suspect immediate prior ECG had arm lead reversal Confirmed by Buford Dresser [phone]) on 09/18/2022 8:44:28 AM   Neuro/Psych  Headaches PSYCHIATRIC DISORDERS Anxiety Depression    TIACVA    GI/Hepatic Neg liver ROS, hiatal hernia,GERD  Medicated and Poorly Controlled,,  Endo/Other  Hypothyroidism    Renal/GU Renal InsufficiencyRenal disease  negative genitourinary   Musculoskeletal negative musculoskeletal ROS (+)    Abdominal   Peds negative pediatric ROS (+)  Hematology  (+) Blood dyscrasia, anemia   Anesthesia Other Findings   Reproductive/Obstetrics negative OB ROS                             Anesthesia Physical Anesthesia Plan  ASA: 3  Anesthesia Plan: General   Post-op Pain Management: Dilaudid IV   Induction: Intravenous and Rapid sequence  PONV Risk Score and Plan: 4 or greater and Ondansetron and Dexamethasone  Airway Management Planned: Oral ETT  Additional Equipment:    Intra-op Plan:   Post-operative Plan: Extubation in OR  Informed Consent: I have reviewed the patients History and Physical, chart, labs and discussed the procedure including the risks, benefits and alternatives for the proposed anesthesia with the patient or authorized representative who has indicated his/her understanding and acceptance.     Dental advisory given  Plan Discussed with: CRNA and Surgeon  Anesthesia Plan Comments:        Anesthesia Quick Evaluation

## 2022-09-21 NOTE — Interval H&P Note (Signed)
History and Physical Interval Note:  09/21/2022 10:08 AM  Brittany Phillips  has presented today for surgery, with the diagnosis of HEMATOMA, LEFT LOWER EXTREMITY, 10 CM.  The various methods of treatment have been discussed with the patient and family. After consideration of risks, benefits and other options for treatment, the patient has consented to  Procedure(s): IRRIGATION AND DEBRIDEMENT HEMATOMA (Left) as a surgical intervention.  The patient's history has been reviewed, patient examined, no change in status, stable for surgery.  I have reviewed the patient's chart and labs.  Questions were answered to the patient's satisfaction.     [place]

## 2022-09-21 NOTE — Progress Notes (Signed)
[facility]  Spoke with the patient's husband and daughter-in-law in the consultation room.  I explained that I was able to clean up the wound.  It was a little bit deeper on the top edge of the wound.  She will need to have daily dressing changes with Santyl, 4 x 4's, Kerlix roll, and Ace wrap.  She will follow-up with me in 2 weeks and we will see how the wound is doing.  I will send her home with a prescription for narcotic pain medication that she can take as pain.  She may resume her 81 mg aspirin after the first couple dressing changes and noting that there is no excessive bleeding.  All questions were answered to their expressed satisfaction.  Graciella Freer, DO [facility] [address] [place], [place] [address] [phone] (office)

## 2022-09-28 ENCOUNTER — Encounter (HOSPITAL_COMMUNITY): Payer: Self-pay | Admitting: Surgery

## 2022-10-06 ENCOUNTER — Telehealth: Payer: Self-pay | Admitting: *Deleted

## 2022-10-06 MED ORDER — SANTYL 250 UNIT/GM EX OINT: 1.0000 | TOPICAL_OINTMENT | Freq: Every day | CUTANEOUS | 0 refills | Status: DC

## 2022-10-06 NOTE — Telephone Encounter (Signed)
Received call from patient.   Requested refill on Santyl Ointment.   Refill sent to pharmacy.

## 2022-10-13 ENCOUNTER — Ambulatory Visit (INDEPENDENT_AMBULATORY_CARE_PROVIDER_SITE_OTHER): Payer: Medicare HMO | Admitting: Surgery

## 2022-10-13 ENCOUNTER — Encounter: Payer: Self-pay | Admitting: Surgery

## 2022-10-13 VITALS — BP 125/79 | HR 60 | Temp 98.0°F | Resp 14 | Ht 62.0 in | Wt 129.0 lb

## 2022-10-13 DIAGNOSIS — Z09 Encounter for follow-up examination after completed treatment for conditions other than malignant neoplasm: Secondary | ICD-10-CM

## 2022-10-13 NOTE — Progress Notes (Signed)
[facility] Note   HPI:  81 y.o. Female presents to clinic for post-op follow-up status post debridement of left lower extremity hematoma on 11/6.  She has been doing well since the surgery.  She and her husband have been taking good care of this wound with daily dressing changes and application of Santyl to the open area.  She denies any fevers or chills.  She is able to be more mobile with her leg and put more weight on her leg.  She denies any purulent drainage from this area.  Review of Systems:  All other review of systems: otherwise negative   Vital Signs:  BP 125/79   Pulse 60   Temp 98 F (36.7 C) (Oral)   Resp 14   Ht 5\' 2"  (1.575 m)   Wt 129 lb (58.5 kg)   SpO2 93%   BMI 23.59 kg/m    Physical Exam:  Physical Exam Vitals reviewed.  Constitutional:      Appearance: Normal appearance.  Skin:    Comments: Left lower extremity with healing hematoma site, 3 x 3 cm circular area with pink granulation tissue, surrounding wound is healing well with overlying layer of skin  Neurological:     Mental Status: She is alert.    Laboratory studies: None  Imaging:  None  Assessment:  81 y.o. yo Female who presents for follow-up status post debridement of left lower extremity hematoma on 11/6  Plan:  -Patient has been doing very well to take care of this wound with daily dressing changes -She had just obtained a refill from her Santyl, so advised her to continue placing Santyl only to the area of granulation tissue.  She should cover the wound with Telfa or other nonadhesive gauze and may wrap with Kerlix and Ace wrap -Once she runs out of Santyl, she should start using antibiotic ointment to the open area -Follow up with me in 3 weeks for reevaluation of the wound  All of the above recommendations were discussed with the patient, and all of patient's questions were answered to her expressed satisfaction.  13/6, DO [facility] [address] [place], [place] [phone] (office)

## 2022-10-13 NOTE — Patient Instructions (Signed)
-  Use Santyl until you run out -Then switch to antibiotic ointment -Continue to clean area and apply new dressing daily

## 2022-11-03 ENCOUNTER — Ambulatory Visit (INDEPENDENT_AMBULATORY_CARE_PROVIDER_SITE_OTHER): Payer: Medicare HMO | Admitting: Surgery

## 2022-11-03 ENCOUNTER — Encounter: Payer: Self-pay | Admitting: Surgery

## 2022-11-03 VITALS — BP 144/81 | HR 59 | Temp 97.6°F | Resp 14 | Ht 62.0 in | Wt 127.0 lb

## 2022-11-03 DIAGNOSIS — Z09 Encounter for follow-up examination after completed treatment for conditions other than malignant neoplasm: Secondary | ICD-10-CM

## 2022-11-03 DIAGNOSIS — S8012XD Contusion of left lower leg, subsequent encounter: Secondary | ICD-10-CM

## 2022-11-03 NOTE — Progress Notes (Signed)
[facility] Note   HPI:  81 y.o. Female presents to clinic for post-op follow-up s/p debridement of left lower extremity hematoma on 11/6. She has been doing well since surgery. There is only a small open area at her debridement site.  She has been applying Santyl ointment to this area.  She denies significant pain associated with the area. She denies any fever or chills.  Review of Systems:  All other review of systems: otherwise negative   Vital Signs:  There were no vitals taken for this visit.   Physical Exam:  Physical Exam Vitals reviewed.  Constitutional:      Appearance: Normal appearance.  Skin:    Comments: Left lower extremity with healing hematoma site, small (<0.5 cm) site with pink granulation tissue, remaining wound is healing well with overlying skin  Neurological:     Mental Status: She is alert.    Laboratory studies: None   Imaging:  None   Assessment:  81 y.o. yo Female who presents for follow up status post debridement of left lower extremity hematoma on 11/6  Plan:  -Advised her to apply antibiotic ointment to the open area for the next week.  After that time, she will no longer need to apply any creams.  She may leave it open to air or covered -Advised her to call if she has any issues with her wound -Follow-up with me in 1 month  All of the above recommendations were discussed with the patient, and all of patient's questions were answered to her expressed satisfaction.  Theophilus Kinds, DO [facility] [address] [place], [place] [address] [phone] (office)

## 2022-11-03 NOTE — Patient Instructions (Signed)
-  Apply antibiotic ointment to the area for the next week -After that time, you do not need to apply any ointments to the area -May leave the area opened and uncovered at that time.  May also cover with wrap if you would prefer -Call us if you have any issues

## 2022-12-09 ENCOUNTER — Ambulatory Visit: Payer: Medicare HMO | Admitting: Anesthesiology

## 2022-12-09 ENCOUNTER — Encounter
Admission: RE | Disposition: A | Discharge status: Home / Self Care | Payer: Self-pay | Source: Ambulatory Visit | Attending: Internal Medicine

## 2022-12-09 ENCOUNTER — Ambulatory Visit
Admission: RE | Admit: 2022-12-09 | Discharge: 2022-12-09 | Disposition: A | Discharge status: Home / Self Care | Payer: Medicare HMO | Source: Ambulatory Visit | Attending: Internal Medicine | Admitting: Internal Medicine

## 2022-12-09 ENCOUNTER — Encounter: Payer: Self-pay | Admitting: Internal Medicine

## 2022-12-09 DIAGNOSIS — I10 Essential (primary) hypertension: Secondary | ICD-10-CM | POA: Insufficient documentation

## 2022-12-09 DIAGNOSIS — E039 Hypothyroidism, unspecified: Secondary | ICD-10-CM | POA: Insufficient documentation

## 2022-12-09 DIAGNOSIS — K2970 Gastritis, unspecified, without bleeding: Secondary | ICD-10-CM | POA: Diagnosis not present

## 2022-12-09 DIAGNOSIS — K200 Eosinophilic esophagitis: Secondary | ICD-10-CM | POA: Insufficient documentation

## 2022-12-09 DIAGNOSIS — K219 Gastro-esophageal reflux disease without esophagitis: Secondary | ICD-10-CM | POA: Insufficient documentation

## 2022-12-09 DIAGNOSIS — R1310 Dysphagia, unspecified: Secondary | ICD-10-CM | POA: Diagnosis not present

## 2022-12-09 HISTORY — PX: ESOPHAGOGASTRODUODENOSCOPY: SHX5428

## 2022-12-09 SURGERY — EGD (ESOPHAGOGASTRODUODENOSCOPY)
Anesthesia: General

## 2022-12-09 MED ORDER — PROPOFOL 10 MG/ML IV BOLUS
INTRAVENOUS | Status: AC
  Filled 2022-12-09: qty 40

## 2022-12-09 MED ORDER — LIDOCAINE HCL URETHRAL/MUCOSAL 2 % EX GEL
CUTANEOUS | Status: AC
  Filled 2022-12-09: qty 5

## 2022-12-09 MED ORDER — SODIUM CHLORIDE 0.9 % IV SOLN: INTRAVENOUS | Status: DC

## 2022-12-09 MED ORDER — PROPOFOL 500 MG/50ML IV EMUL
INTRAVENOUS | Status: DC | PRN
  Administered 2022-12-09: 150 ug/kg/min via INTRAVENOUS

## 2022-12-09 MED ORDER — LIDOCAINE HCL (CARDIAC) PF 100 MG/5ML IV SOSY
PREFILLED_SYRINGE | INTRAVENOUS | Status: DC | PRN
  Administered 2022-12-09: 50 mg via INTRAVENOUS

## 2022-12-09 NOTE — H&P (Signed)
Outpatient short stay form Pre-procedure 12/09/2022 10:10 AM Brittany Phillips K. Brittany Phillips, M.D.  Primary Physician: Emily Filbert, M.D.  Reason for visit:   Gastroesophageal Reflux    Dysphagia    Hemorrhoids    Rectal Bleeding     History of present illness:  Brittany Phillips reports having worsening issues with reflux recently. She feels that she is having frequent regurgitation where she is regurgitating foods within 30 minutes to an hour after meals. She will often have to spit out the foods, feels that this is occurring most days. Endorses some dysphagia that foods can be harder to swallow but endorses more issues with regurgitation than dysphagia. She tries to remain upright after meals. She has her biggest meal around 530pm. She tries to avoid hamburger and beef as these worsen the symptoms. She does endorse some early satiety. She is not feeling esophageal burning but does note she feels a sour sensation if she has milk products. She does take Carafate and feels that this helps the symptoms. She has nausea but denies vomiting. She does endorse some early satiety. Occasionally uses Pepto for symptoms.  She is generally having a bowel movement about every other day to every 2 days. She denies any issues with constipation, diarrhea, melena, rectal bleeding. She denies any lower GI symptoms. She takes a probiotic daily.  She denies alcohol tobacco and NSAIDs. She reports she has a good appetite.     Current Facility-Administered Medications:    0.9 %  sodium chloride infusion, , Intravenous, Continuous, Ladamien Rammel, Benay Pike, MD  Medications Prior to Admission  Medication Sig Dispense Refill Last Dose   aspirin EC 81 MG tablet Take 1 tablet (81 mg total) by mouth at bedtime. 30 tablet 11 12/08/2022   Cholecalciferol (VITAMIN D3) 2000 units TABS Take 2,000 Units by mouth daily.   12/08/2022   clonazePAM (KLONOPIN) 0.25 MG disintegrating tablet Take 0.25 mg by mouth 2 (two) times daily.   12/08/2022   estradiol  (ESTRACE) 1 MG tablet Take 1 mg by mouth daily.   12/08/2022   gabapentin (NEURONTIN) 100 MG capsule Take 200 mg by mouth 2 (two) times daily.   12/08/2022   levothyroxine (SYNTHROID, LEVOTHROID) 75 MCG tablet Take 75 mcg by mouth daily before breakfast.   12/08/2022   pantoprazole (PROTONIX) 40 MG tablet Take 40 mg by mouth 2 (two) times daily.   12/08/2022   propranolol (INDERAL) 40 MG tablet Take 40-80 mg by mouth See admin instructions. Take 80 mg by mouth in the morning and 40 mg at night   12/08/2022   sucralfate (CARAFATE) 1 g tablet Take 1 g by mouth 3 (three) times daily with meals.   12/08/2022   triamterene-hydrochlorothiazide (DYAZIDE) 37.5-25 MG capsule Take 2 capsules by mouth daily.   12/08/2022   vitamin B-12 (CYANOCOBALAMIN) 1000 MCG tablet Take 1,000 mcg by mouth daily.   12/08/2022   collagenase (SANTYL) 250 UNIT/GM ointment Apply 1 Application topically daily. Apply to left leg wound daily.  Cover with damp 4x4, and wrap with gauze roll and ace wrap 30 g 0    Menthol-Methyl Salicylate (MUSCLE RUB) 10-15 % CREA Apply 1 Application topically in the morning and at bedtime.      oxyCODONE (ROXICODONE) 5 MG immediate release tablet Take 1 tablet (5 mg total) by mouth every 6 (six) hours as needed. 15 tablet 0    polyvinyl alcohol (LIQUIFILM TEARS) 1.4 % ophthalmic solution Place 1 drop into both eyes in the morning and at bedtime.  Allergies  Allergen Reactions   Peanut-Containing Drug Products Anaphylaxis   Ace Inhibitors Cough   Cephalexin Other (See Comments)    GI upset   Hydrocodone-Acetaminophen Nausea Only   Tizanidine Itching   Wild Lettuce Extract (Lactuca Virosa) Swelling   Codeine Hives and Other (See Comments)    Reaction:  Weakness      Metoclopramide Hives and Other (See Comments)    Caused irregular hand movements     Past Medical History:  Diagnosis Date   Anemia    Anxiety    Cervicalgia    Depression    Dysrhythmia    GERD (gastroesophageal  reflux disease)    Headache    History of hiatal hernia    Hyperlipidemia    Hyperlipidemia    Hypertension    Loss of hearing    aids   Lumbar disc disease    Migraines    Neuralgia    Post menopausal problems    PVC's (premature ventricular contractions)    Rosacea    Stroke (HCC)    TIA (transient ischemic attack) 2017   Vitamin D deficiency    Wears hearing aid in both ears     Review of systems:  Otherwise negative.    Physical Exam  Gen: Alert, oriented. Appears stated age.  HEENT: [place]/AT. PERRLA. Lungs: CTA, no wheezes. CV: RR nl S1, S2. Abd: soft, benign, no masses. BS+ Ext: No edema. Pulses 2+    Planned procedures: Proceed with EGD. The patient understands the nature of the planned procedure, indications, risks, alternatives and potential complications including but not limited to bleeding, infection, perforation, damage to internal organs and possible oversedation/side effects from anesthesia. The patient agrees and gives consent to proceed.  Please refer to procedure notes for findings, recommendations and patient disposition/instructions.     Brittany Phillips K. Brittany Phillips, M.D. Gastroenterology 12/09/2022  10:10 AM

## 2022-12-09 NOTE — Op Note (Addendum)
[facility] Patient Name: Brittany Phillips Procedure Date: 12/09/2022 11:03 AM MRN: [id number] Account #: [id number] Date of Birth: Jan 09, 1941 Admit Type: Outpatient Age: 82 Room: [facility] ENDO ROOM 2 Gender: Female Note Status: Finalized Instrument Name: Altamese Cabal Endoscope [id number] Procedure:             Upper GI endoscopy Indications:           Suspected gastro-esophageal reflux disease, Failure to                         respond to medical treatment Providers:             Benay Pike. Alice Reichert MD, MD Referring MD:          Rusty Aus, MD (Referring MD) Medicines:             Propofol per Anesthesia Complications:         No immediate complications. Procedure:             Pre-Anesthesia Assessment:                        - The risks and benefits of the procedure and the                         sedation options and risks were discussed with the                         patient. All questions were answered and informed                         consent was obtained.                        - Patient identification and proposed procedure were                         verified prior to the procedure by the nurse. The                         procedure was verified in the endoscopy suite.                        - ASA Grade Assessment: III - A patient with severe                         systemic disease.                        - After reviewing the risks and benefits, the patient                         was deemed in satisfactory condition to undergo the                         procedure.                        After obtaining informed consent, the endoscope was  passed under direct vision. Throughout the procedure,                         the patient's blood pressure, pulse, and oxygen                         saturations were monitored continuously. The Endoscope                         was introduced through the mouth, and advanced to the                          third part of duodenum. The upper GI endoscopy was                         accomplished without difficulty. The patient tolerated                         the procedure well. Findings:      The examined esophagus was normal. Biopsies were obtained from the       proximal and distal esophagus with cold forceps for histology of       suspected eosinophilic esophagitis.      Patchy mild inflammation characterized by erythema was found in the       gastric body. Biopsies were taken with a cold forceps for Helicobacter       pylori testing.      The examined duodenum was normal.      The exam was otherwise without abnormality. Impression:            - Normal esophagus.                        - Gastritis. Biopsied.                        - Normal examined duodenum.                        - The examination was otherwise normal.                        - Biopsies were taken with a cold forceps for                         evaluation of eosinophilic esophagitis. Recommendation:        - Patient has a contact number available for                         emergencies. The signs and symptoms of potential                         delayed complications were discussed with the patient.                         Return to normal activities tomorrow. Written                         discharge instructions were provided to the patient.                        -  Resume previous diet.                        - Continue present medications.                        - Discontinue Carafate if possible.                        - Return to physician assistant in 6 weeks.                        - Follow up with Laurine Blazer, PA-C at [facility]. [phone].                        - Telephone GI office to schedule appointment. Procedure Code(s):     --- Professional ---                        [phone], Esophagogastroduodenoscopy, flexible,                          transoral; with biopsy, single or multiple Diagnosis Code(s):     --- Professional ---                        K29.70, Gastritis, unspecified, without bleeding CPT copyright 2022 American Medical Association. All rights reserved. The codes documented in this report are preliminary and upon coder review may  be revised to meet current compliance requirements. Efrain Sella MD, MD 12/09/2022 11:25:37 AM This report has been signed electronically. Number of Addenda: 0 Note Initiated On: 12/09/2022 11:03 AM Estimated Blood Loss:  Estimated blood loss: none.      [facility]

## 2022-12-09 NOTE — Anesthesia Preprocedure Evaluation (Addendum)
Anesthesia Evaluation  Patient identified by MRN, date of birth, ID band Patient awake    Reviewed: Allergy & Precautions, NPO status , Patient's Chart, lab work & pertinent test results  History of Anesthesia Complications Negative for: history of anesthetic complications  Airway Mallampati: IV   Neck ROM: Full    Dental  (+) Missing   Pulmonary neg pulmonary ROS   Pulmonary exam normal breath sounds clear to auscultation       Cardiovascular hypertension, Normal cardiovascular exam Rhythm:Regular Rate:Normal  ECG 09/17/22:  Normal sinus rhythm Cannot rule out Anterior infarct , age undetermined Borderline ECG When compared with ECG of 17-Sep-2022 14:19,   Neuro/Psych  Headaches PSYCHIATRIC DISORDERS Anxiety Depression    HOH TIA (01/2016)   GI/Hepatic ,GERD  ,,  Endo/Other  Hypothyroidism    Renal/GU negative Renal ROS     Musculoskeletal   Abdominal   Peds  Hematology  (+) Blood dyscrasia, anemia   Anesthesia Other Findings   Reproductive/Obstetrics                             Anesthesia Physical Anesthesia Plan  ASA: 2  Anesthesia Plan: General   Post-op Pain Management:    Induction: Intravenous  PONV Risk Score and Plan: 3 and Propofol infusion, TIVA and Treatment may vary due to age or medical condition  Airway Management Planned: Natural Airway  Additional Equipment:   Intra-op Plan:   Post-operative Plan:   Informed Consent: I have reviewed the patients History and Physical, chart, labs and discussed the procedure including the risks, benefits and alternatives for the proposed anesthesia with the patient or authorized representative who has indicated his/her understanding and acceptance.       Plan Discussed with: CRNA  Anesthesia Plan Comments: (LMA/GETA backup discussed.  Patient consented for risks of anesthesia including but not limited to:  - adverse  reactions to medications - damage to eyes, teeth, lips or other oral mucosa - nerve damage due to positioning  - sore throat or hoarseness - damage to heart, brain, nerves, lungs, other parts of body or loss of life  Informed patient about role of CRNA in peri- and intra-operative care.  Patient voiced understanding.)        Anesthesia Quick Evaluation

## 2022-12-09 NOTE — Anesthesia Postprocedure Evaluation (Signed)
Anesthesia Post Note  Patient: Brittany Phillips  Procedure(s) Performed: ESOPHAGOGASTRODUODENOSCOPY (EGD)  Patient location during evaluation: PACU Anesthesia Type: General Level of consciousness: awake and alert, oriented and patient cooperative Pain management: pain level controlled Vital Signs Assessment: post-procedure vital signs reviewed and stable Respiratory status: spontaneous breathing, nonlabored ventilation and respiratory function stable Cardiovascular status: blood pressure returned to baseline and stable Postop Assessment: adequate PO intake Anesthetic complications: no   No notable events documented.   Last Vitals:  Vitals:   12/09/22 1142 12/09/22 1152  BP: (!) 128/58 127/62  Pulse: (!) 54 (!) 50  Resp: 16 11  Temp:    SpO2: 100% 100%    Last Pain:  Vitals:   12/09/22 1142  TempSrc:   PainSc: 0-No pain                 Darrin Nipper

## 2022-12-09 NOTE — Transfer of Care (Signed)
Immediate Anesthesia Transfer of Care Note  Patient: Brittany Phillips  Procedure(s) Performed: ESOPHAGOGASTRODUODENOSCOPY (EGD)  Patient Location: PACU  Anesthesia Type:General  Level of Consciousness: drowsy  Airway & Oxygen Therapy: Patient Spontanous Breathing and Patient connected to face mask oxygen  Post-op Assessment: Report given to RN and Post -op Vital signs reviewed and stable  Post vital signs: Reviewed and stable  Last Vitals:  Vitals Value Taken Time  BP    Temp    Pulse 53 12/09/22 1122  Resp 13 12/09/22 1122  SpO2 100 % 12/09/22 1122  Vitals shown include unvalidated device data.  Last Pain:  Vitals:   12/09/22 0951  TempSrc: Temporal  PainSc: 0-No pain         Complications: No notable events documented.

## 2022-12-10 ENCOUNTER — Encounter: Payer: Self-pay | Admitting: Internal Medicine

## 2022-12-10 LAB — SURGICAL PATHOLOGY

## 2022-12-15 ENCOUNTER — Ambulatory Visit (INDEPENDENT_AMBULATORY_CARE_PROVIDER_SITE_OTHER): Payer: Medicare HMO | Admitting: Surgery

## 2022-12-15 ENCOUNTER — Encounter: Payer: Self-pay | Admitting: Surgery

## 2022-12-15 VITALS — BP 122/76 | HR 57 | Temp 98.2°F | Resp 12 | Ht 62.0 in | Wt 130.0 lb

## 2022-12-15 DIAGNOSIS — Z09 Encounter for follow-up examination after completed treatment for conditions other than malignant neoplasm: Secondary | ICD-10-CM

## 2022-12-16 NOTE — Progress Notes (Signed)
[facility] Note   HPI:  82 y.o. Female presents to clinic for wound check status post irrigation and debridement left lower extremity hematoma on 09/21/2022.  Since her last evaluation in the office, the area has completely closed.  She is no longer putting anything over top of the area.  She denies any pain associated with this area.  Denies fevers and chills.  Review of Systems:  All other review of systems: otherwise negative   Vital Signs:  BP 122/76   Pulse (!) 57   Temp 98.2 F (36.8 C) (Oral)   Resp 12   Ht 5\' 2"  (1.575 m)   Wt 130 lb (59 kg)   SpO2 96%   BMI 23.78 kg/m    Physical Exam:  Physical Exam Vitals reviewed.  Constitutional:      Appearance: Normal appearance.  Skin:    Comments: Left lower extremity with healed wound, scarring present, no significant pain associated with this area  Neurological:     Mental Status: She is alert.    Laboratory studies: None  Imaging:  None  Assessment:  82 y.o. yo Female who presents for wound check status post irrigation and debridement of left lower extremity hematoma on 09/21/2022.  Plan:  -The area has completely healed at this point -No further interventions needed -Follow up as needed  All of the above recommendations were discussed with the patient, and all of patient's questions were answered to her expressed satisfaction.  Graciella Freer, DO [facility] [address] [place], [place] [address] [phone] (office)

## 2022-12-23 ENCOUNTER — Other Ambulatory Visit: Payer: Self-pay | Admitting: Gastroenterology

## 2022-12-23 DIAGNOSIS — K219 Gastro-esophageal reflux disease without esophagitis: Secondary | ICD-10-CM

## 2022-12-23 DIAGNOSIS — R110 Nausea: Secondary | ICD-10-CM

## 2023-01-07 ENCOUNTER — Ambulatory Visit
Admission: RE | Admit: 2023-01-07 | Discharge: 2023-01-07 | Disposition: A | Discharge status: Home / Self Care | Payer: Medicare HMO | Source: Ambulatory Visit | Attending: Gastroenterology | Admitting: Gastroenterology

## 2023-01-07 DIAGNOSIS — K219 Gastro-esophageal reflux disease without esophagitis: Secondary | ICD-10-CM | POA: Diagnosis present

## 2023-01-07 DIAGNOSIS — R110 Nausea: Secondary | ICD-10-CM | POA: Insufficient documentation

## 2023-06-22 ENCOUNTER — Telehealth: Payer: Self-pay | Admitting: *Deleted

## 2023-06-22 NOTE — Telephone Encounter (Signed)
Received call from patient [phone]~ telephone.   Reports that she dropped plate or platter on right lower leg last week. States that she has discoloration and hematoma form at site that was struck on her leg. Reports that she has pain and swelling at site as well.   Advised to schedule appointment with PCP to assess for fracture or other injury.   Advised that hematoma should eventually be absorbed by the body. Advised that it may take 2-3 weeks prior to reabsorption. Advised if no further injury noted to leg, [facility] would be happy to see her to determine if evacuation is required.

## 2023-06-29 NOTE — Telephone Encounter (Signed)
Call placed to patient and patient made aware.  

## 2023-06-29 NOTE — Telephone Encounter (Signed)
Received call from patient [phone]~ telephone.   Reports that she was seen by PCP and was advised to see surgeon for evaluation.   Appointment scheduled.   Request for chart notes/ imaging sent to PCP.

## 2023-07-06 ENCOUNTER — Ambulatory Visit: Payer: Medicare HMO | Admitting: Surgery

## 2023-07-06 ENCOUNTER — Encounter: Payer: Self-pay | Admitting: Surgery

## 2023-07-06 ENCOUNTER — Other Ambulatory Visit: Payer: Self-pay

## 2023-07-06 VITALS — BP 115/74 | HR 57 | Temp 98.2°F | Resp 16 | Ht 62.0 in | Wt 126.0 lb

## 2023-07-06 DIAGNOSIS — S8011XA Contusion of right lower leg, initial encounter: Secondary | ICD-10-CM

## 2023-07-06 NOTE — Progress Notes (Unsigned)
[facility] Note   HPI:  82 y.o. Female presents to clinic for a new right shin hematoma.  About 3 weeks ago, she dropped a plate and it landed on her right shin.  Since that time she is had a hematoma associated with the area.  The hematoma has not really decreased in size since that time.  She did follow-up with her primary care doctor, who obtained an x-ray which was negative.  She does have some occasional pains associated with the area, but her biggest concern is that she is going to bump her shin into something, and it will result with a wound on her extremity.  She has no new medical history and has had no new surgeries since she was last seen by me.  She does take an 81 mg aspirin daily.  Denies fevers and chills.  Review of Systems:  All other review of systems: otherwise negative   Vital Signs:  BP 115/74   Pulse (!) 57   Temp 98.2 F (36.8 C) (Oral)   Resp 16   Ht 5\' 2"  (1.575 m)   Wt 126 lb (57.2 kg)   SpO2 96%   BMI 23.05 kg/m    Physical Exam:  Physical Exam Vitals reviewed.  Constitutional:      Appearance: Normal appearance.  HENT:     Head: Normocephalic and atraumatic.  Eyes:     Extraocular Movements: Extraocular movements intact.     Pupils: Pupils are equal, round, and reactive to light.  Cardiovascular:     Rate and Rhythm: Normal rate and regular rhythm.  Pulmonary:     Effort: Pulmonary effort is normal.     Breath sounds: Normal breath sounds.  Abdominal:     General: There is no distension.     Palpations: Abdomen is soft.     Tenderness: There is no abdominal tenderness.  Musculoskeletal:        General: Normal range of motion.     Cervical back: Normal range of motion.  Skin:    General: Skin is warm and dry.     Comments: Right shin with 4 x 3 cm hematoma, no evidence of infection, no surrounding erythema or induration, minimally tender to palpation  Neurological:     General: No focal deficit present.     Mental Status:  She is alert and oriented to person, place, and time.  Psychiatric:        Mood and Affect: Mood normal.        Behavior: Behavior normal.     Laboratory studies: None  Imaging:  None  Assessment:  82 y.o. yo Female who presents for evaluation of a right shin hematoma.  Plan:  -We discussed the options of managing this hematoma, including leaving the area alone and letting her reabsorb the blood with time.  I explained that given it has not changed much in 3 weeks, this could take several months to fully resolve.  The other option we discussed with me unroofing the hematoma and treating it as an open wound. -Patient has expressed that she would prefer to unroofed the hematoma and treat as an open wound, likely did with her previous hematoma on her left lower extremity.  The risk and benefits of hematoma evacuation and debridement were discussed with the patient, including but not limited to bleeding, infection, injury to surrounding structures, and need for additional procedures.  After careful consideration, Renice Solarz has decided to proceed with hematoma evacuation and debridement. -  Please refer to below for the details of the procedure -She has some remaining Santyl at home.  I advised that she should leave the current dressing on the wound until tomorrow.  At that time, she should start placing Santyl, saline dampened 4 x 4's, and Ace wrap around her right lower extremity. -Advised that she should hold her aspirin for the next 3 days -Follow up with Dr. Henreitta Leber next week, and me in 3 weeks  All of the above recommendations were discussed with the patient, and all of patient's questions were answered to her expressed satisfaction.   Bedside procedure note   Preoperative Diagnosis: Right lower extremity hematoma Postoperative Diagnosis: Right lower extremity hematoma   Procedure(s) Performed: Debridement of right lower extremity hematoma   Performing provider: Santina Evans  Constancia Geeting, DO   Estimated Blood Loss: Minimal   Findings: 4 x 3 cm wound down to subcutaneous fat on the right lower extremity (12 cm of debridement)   Procedure: At bedside, right lower extremity hematoma was prepped with Betadine.  Verbal consent obtained from the patient prior to beginning of procedure.  Timeout was performed.  The underlying skin was localized with 1% lidocaine with epinephrine.  Using scalpel, the skin overlying the hematoma was sharply excised.  Then using a scrub brush, the overlying hematoma was gently scrubbed off.  With a combination of sharp excision and scrubbing with a scrub brush, the hematoma was able to be fully removed from the healthy subcutaneous fat.  Hemostasis was achieved with hand-held electrocautery tip and silver nitrate.  The final wound measured 4 x 3 cm.  The wound was then dressed with bacitracin, damp 4 x 4's, dry 4 x 4's, and wrapped with an Ace wrap.  Patient tolerated the procedure without issue.   Theophilus Kinds, DO [facility] [address] [place], [place] [address] [phone] (office)

## 2023-07-06 NOTE — Patient Instructions (Signed)
Wound Care Instructions:  -Leave dressing on for 24 hours after office visit -May shower over top of wound.  Pat dry after showering -Apply Santyl to open wound -Pack wound with saline dampened gauze -Cover with dry gauze and wrap with Ace wrap -Call if bleeding, worsening pain, purulent drainage, fever, or chills

## 2023-07-15 ENCOUNTER — Ambulatory Visit (INDEPENDENT_AMBULATORY_CARE_PROVIDER_SITE_OTHER): Payer: Medicare HMO | Admitting: General Surgery

## 2023-07-15 ENCOUNTER — Other Ambulatory Visit: Payer: Self-pay

## 2023-07-15 ENCOUNTER — Encounter: Payer: Self-pay | Admitting: General Surgery

## 2023-07-15 VITALS — BP 110/68 | HR 63 | Temp 98.8°F | Resp 20 | Ht 62.0 in | Wt 125.0 lb

## 2023-07-15 DIAGNOSIS — S8011XA Contusion of right lower leg, initial encounter: Secondary | ICD-10-CM

## 2023-07-15 NOTE — Patient Instructions (Signed)
Continue dressing changes. If becomes superficial can just cover with dry gauze and do some neosporin over the wound if you run out of santyl.

## 2023-07-15 NOTE — Progress Notes (Signed)
[facility]  Doing well with the dressing changes. No issues.  BP 110/68   Pulse 63   Temp 98.8 F (37.1 C) (Oral)   Resp 20   Ht 5\' 2"  (1.575 m)   Wt 125 lb (56.7 kg)   SpO2 93%   BMI 22.86 kg/m  Right leg wound healing, granulating    Patient s/p debridement of a hematoma on the right. Doing well.   Continue dressing changes. If becomes superficial can just cover with dry gauze and do some neosporin over the wound if you run out of santyl.   Algis Greenhouse, MD [facility] [address] [place], [place] [address] [phone] (office)

## 2023-07-27 ENCOUNTER — Encounter: Payer: Self-pay | Admitting: Surgery

## 2023-07-27 ENCOUNTER — Ambulatory Visit (INDEPENDENT_AMBULATORY_CARE_PROVIDER_SITE_OTHER): Payer: Medicare HMO | Admitting: Surgery

## 2023-07-27 VITALS — BP 108/58 | HR 61 | Temp 97.7°F | Resp 12 | Ht 62.0 in | Wt 125.0 lb

## 2023-07-27 DIAGNOSIS — S8011XD Contusion of right lower leg, subsequent encounter: Secondary | ICD-10-CM

## 2023-07-27 NOTE — Patient Instructions (Signed)
Wound Care Instructions:  -Apply antibiotic ointment daily after bathing -Cover with non-adherent (non-stick) gauze -Wrap with ace wrap

## 2023-07-30 NOTE — Progress Notes (Signed)
[facility] Note   HPI:  82 y.o. Female presents to clinic for follow-up status post right lower extremity hematoma debridement on 8/20.  Patient has been doing very well since debridement.  She is still applying Santyl to the wound.  She feels that it has significantly improved.  She denies significant pain or drainage from the area.  She denies fevers and chills.  Review of Systems:  All other review of systems: otherwise negative   Vital Signs:  BP (!) 108/58   Pulse 61   Temp 97.7 F (36.5 C) (Oral)   Resp 12   Ht 5\' 2"  (1.575 m)   Wt 125 lb (56.7 kg)   SpO2 90%   BMI 22.86 kg/m    Physical Exam:  Physical Exam Vitals reviewed.  Constitutional:      Appearance: Normal appearance.  Skin:    Comments: Right lower extremity wound with pink granulation tissue at the base, no evidence of necrotic tissue present, no surrounding erythema or induration  Neurological:     Mental Status: She is alert.     Laboratory studies: None  Imaging:  None  Assessment:  82 y.o. yo Female who presents for follow-up status post right lower extremity hematoma debridement on 8/20.  Plan:  -The area appears to be significantly improving.  She no longer needs to use Santyl on the wound.  Advised that she should cover the area with antibiotic ointment and nonadherent gauze daily. -Follow up with me in 1 month  All of the above recommendations were discussed with the patient, and all of patient's questions were answered to her expressed satisfaction.  Theophilus Kinds, DO [facility] [address] [place], [place] [address] [phone] (office)

## 2023-08-24 ENCOUNTER — Ambulatory Visit: Payer: Medicare HMO | Admitting: Surgery

## 2023-08-24 ENCOUNTER — Encounter: Payer: Self-pay | Admitting: Surgery

## 2023-08-24 VITALS — BP 112/61 | HR 55 | Temp 97.7°F | Resp 12 | Ht 62.0 in | Wt 123.0 lb

## 2023-08-24 DIAGNOSIS — S8011XD Contusion of right lower leg, subsequent encounter: Secondary | ICD-10-CM | POA: Diagnosis not present

## 2023-08-26 NOTE — Progress Notes (Signed)
[facility] Note   HPI:  82 y.o. Female presents to clinic for follow-up status post right lower extremity hematoma debridement on 8/20.  She has been doing very well, and states that the area has completely healed over.  She denies any further issues at this location or at any other locations.  Denies fevers and chills.  Review of Systems:   All other review of systems: otherwise negative   Vital Signs:  BP 112/61   Pulse (!) 55   Temp 97.7 F (36.5 C) (Oral)   Resp 12   Ht 5\' 2"  (1.575 m)   Wt 123 lb (55.8 kg)   SpO2 99%   BMI 22.50 kg/m    Physical Exam:  Physical Exam Vitals reviewed.  Constitutional:      Appearance: Normal appearance.  Skin:    General: Skin is warm and dry.     Comments: Right lower extremity with well-healed previous hematoma debridement location, no surrounding erythema or induration  Neurological:     Mental Status: She is alert.     Laboratory studies: None  Imaging:  None  Assessment:  82 y.o. yo Female who presents for follow-up status post right lower extremity hematoma debridement on 8/20  Plan:  -Since the area has completely healed over, patient does not require any further treatments to the area -Follow up with me as needed  All of the above recommendations were discussed with the patient, and all of patient's questions were answered to her expressed satisfaction.  Theophilus Kinds, DO [facility] [address] [place], [place] [address] [phone] (office)

## 2023-10-05 ENCOUNTER — Other Ambulatory Visit: Payer: Self-pay | Admitting: Student

## 2023-10-05 DIAGNOSIS — R590 Localized enlarged lymph nodes: Secondary | ICD-10-CM

## 2023-10-11 ENCOUNTER — Ambulatory Visit
Admission: RE | Admit: 2023-10-11 | Discharge: 2023-10-11 | Disposition: A | Discharge status: Home / Self Care | Payer: Medicare HMO | Source: Ambulatory Visit | Attending: Student | Admitting: Student

## 2023-10-11 ENCOUNTER — Other Ambulatory Visit: Payer: Self-pay | Admitting: Student

## 2023-10-11 ENCOUNTER — Ambulatory Visit
Admission: RE | Admit: 2023-10-11 | Discharge: 2023-10-11 | Disposition: A | Discharge status: Home / Self Care | Payer: Medicare HMO | Source: Ambulatory Visit | Attending: Student

## 2023-10-11 DIAGNOSIS — R590 Localized enlarged lymph nodes: Secondary | ICD-10-CM

## 2023-12-24 ENCOUNTER — Other Ambulatory Visit: Payer: Self-pay | Admitting: Gastroenterology

## 2023-12-24 DIAGNOSIS — R1111 Vomiting without nausea: Secondary | ICD-10-CM

## 2023-12-24 DIAGNOSIS — R634 Abnormal weight loss: Secondary | ICD-10-CM

## 2023-12-24 DIAGNOSIS — R1013 Epigastric pain: Secondary | ICD-10-CM

## 2023-12-24 DIAGNOSIS — R110 Nausea: Secondary | ICD-10-CM

## 2023-12-30 ENCOUNTER — Ambulatory Visit
Admission: RE | Admit: 2023-12-30 | Discharge: 2023-12-30 | Disposition: A | Discharge status: Home / Self Care | Payer: Medicare HMO | Source: Ambulatory Visit | Attending: Gastroenterology | Admitting: Gastroenterology

## 2023-12-30 DIAGNOSIS — R634 Abnormal weight loss: Secondary | ICD-10-CM | POA: Insufficient documentation

## 2023-12-30 DIAGNOSIS — R110 Nausea: Secondary | ICD-10-CM | POA: Insufficient documentation

## 2023-12-30 DIAGNOSIS — R1111 Vomiting without nausea: Secondary | ICD-10-CM | POA: Diagnosis present

## 2023-12-30 DIAGNOSIS — R1013 Epigastric pain: Secondary | ICD-10-CM | POA: Diagnosis present

## 2024-01-19 ENCOUNTER — Encounter: Payer: Self-pay | Admitting: Internal Medicine

## 2024-01-19 ENCOUNTER — Other Ambulatory Visit: Payer: Self-pay

## 2024-01-19 ENCOUNTER — Ambulatory Visit: Payer: Self-pay | Admitting: Anesthesiology

## 2024-01-19 ENCOUNTER — Ambulatory Visit
Admission: RE | Admit: 2024-01-19 | Discharge: 2024-01-19 | Disposition: A | Discharge status: Home / Self Care | Payer: Medicare HMO | Attending: Internal Medicine | Admitting: Internal Medicine

## 2024-01-19 ENCOUNTER — Encounter
Admission: RE | Disposition: A | Discharge status: Home / Self Care | Payer: Self-pay | Source: Home / Self Care | Attending: Internal Medicine

## 2024-01-19 DIAGNOSIS — E039 Hypothyroidism, unspecified: Secondary | ICD-10-CM | POA: Diagnosis not present

## 2024-01-19 DIAGNOSIS — Z6822 Body mass index (BMI) 22.0-22.9, adult: Secondary | ICD-10-CM | POA: Diagnosis not present

## 2024-01-19 DIAGNOSIS — W448XXA Other foreign body entering into or through a natural orifice, initial encounter: Secondary | ICD-10-CM | POA: Diagnosis not present

## 2024-01-19 DIAGNOSIS — R634 Abnormal weight loss: Secondary | ICD-10-CM | POA: Insufficient documentation

## 2024-01-19 DIAGNOSIS — D128 Benign neoplasm of rectum: Secondary | ICD-10-CM | POA: Insufficient documentation

## 2024-01-19 DIAGNOSIS — K219 Gastro-esophageal reflux disease without esophagitis: Secondary | ICD-10-CM | POA: Diagnosis not present

## 2024-01-19 DIAGNOSIS — I700 Atherosclerosis of aorta: Secondary | ICD-10-CM | POA: Diagnosis not present

## 2024-01-19 DIAGNOSIS — R1013 Epigastric pain: Secondary | ICD-10-CM | POA: Diagnosis present

## 2024-01-19 DIAGNOSIS — K5730 Diverticulosis of large intestine without perforation or abscess without bleeding: Secondary | ICD-10-CM | POA: Insufficient documentation

## 2024-01-19 DIAGNOSIS — Z8673 Personal history of transient ischemic attack (TIA), and cerebral infarction without residual deficits: Secondary | ICD-10-CM | POA: Diagnosis not present

## 2024-01-19 DIAGNOSIS — I10 Essential (primary) hypertension: Secondary | ICD-10-CM | POA: Diagnosis not present

## 2024-01-19 DIAGNOSIS — T184XXA Foreign body in colon, initial encounter: Secondary | ICD-10-CM | POA: Diagnosis not present

## 2024-01-19 DIAGNOSIS — K644 Residual hemorrhoidal skin tags: Secondary | ICD-10-CM | POA: Diagnosis not present

## 2024-01-19 DIAGNOSIS — K642 Third degree hemorrhoids: Secondary | ICD-10-CM | POA: Insufficient documentation

## 2024-01-19 DIAGNOSIS — K449 Diaphragmatic hernia without obstruction or gangrene: Secondary | ICD-10-CM | POA: Insufficient documentation

## 2024-01-19 HISTORY — PX: POLYPECTOMY: SHX5525

## 2024-01-19 HISTORY — PX: COLONOSCOPY WITH PROPOFOL: SHX5780

## 2024-01-19 SURGERY — COLONOSCOPY WITH PROPOFOL
Anesthesia: General

## 2024-01-19 MED ORDER — PROPOFOL 10 MG/ML IV BOLUS
INTRAVENOUS | Status: AC
  Filled 2024-01-19: qty 40

## 2024-01-19 MED ORDER — SODIUM CHLORIDE 0.9 % IV SOLN: INTRAVENOUS | Status: DC

## 2024-01-19 MED ORDER — LIDOCAINE HCL (CARDIAC) PF 100 MG/5ML IV SOSY
PREFILLED_SYRINGE | INTRAVENOUS | Status: DC | PRN
  Administered 2024-01-19: 30 mg via INTRAVENOUS

## 2024-01-19 MED ORDER — PROPOFOL 500 MG/50ML IV EMUL
INTRAVENOUS | Status: DC | PRN
  Administered 2024-01-19: 100 ug/kg/min via INTRAVENOUS
  Administered 2024-01-19: 30 mg via INTRAVENOUS

## 2024-01-19 MED ORDER — LIDOCAINE HCL (PF) 2 % IJ SOLN
INTRAMUSCULAR | Status: AC
  Filled 2024-01-19: qty 5

## 2024-01-19 NOTE — Anesthesia Postprocedure Evaluation (Signed)
 Anesthesia Post Note  Patient: Brittany Phillips  Procedure(s) Performed: COLONOSCOPY WITH PROPOFOL POLYPECTOMY  Patient location during evaluation: Endoscopy Anesthesia Type: General Level of consciousness: awake and alert Pain management: pain level controlled Vital Signs Assessment: post-procedure vital signs reviewed and stable Respiratory status: spontaneous breathing, nonlabored ventilation, respiratory function stable and patient connected to nasal cannula oxygen Cardiovascular status: blood pressure returned to baseline and stable Postop Assessment: no apparent nausea or vomiting Anesthetic complications: no   No notable events documented.   Last Vitals:  Vitals:   01/19/24 1140 01/19/24 1150  BP: 123/78 131/63  Pulse: 61 (!) 53  Resp: (!) 24 19  Temp:    SpO2: 97% 100%    Last Pain:  Vitals:   01/19/24 1150  TempSrc:   PainSc: 0-No pain                 Cleda Mccreedy [place]

## 2024-01-19 NOTE — Transfer of Care (Signed)
 Immediate Anesthesia Transfer of Care Note  Patient: Brittany Phillips  Procedure(s) Performed: COLONOSCOPY WITH PROPOFOL POLYPECTOMY  Patient Location: PACU  Anesthesia Type:MAC  Level of Consciousness: awake and alert   Airway & Oxygen Therapy: Patient Spontanous Breathing and Patient connected to nasal cannula oxygen  Post-op Assessment: Report given to RN and Post -op Vital signs reviewed and stable  Post vital signs: stable  Last Vitals:  Vitals Value Taken Time  BP    Temp    Pulse    Resp    SpO2      Last Pain:  Vitals:   01/19/24 0939  TempSrc: Oral  PainSc: 0-No pain         Complications: No notable events documented.

## 2024-01-19 NOTE — Interval H&P Note (Signed)
 History and Physical Interval Note:  01/19/2024 10:16 AM  Brittany Phillips  has presented today for surgery, with the diagnosis of Weight loss (R63.4) Nausea (R11.0) Abdominal pain, unspecified abdominal location (R10.9) Abnormal CT scan, gastrointestinal tract (R93.3).  The various methods of treatment have been discussed with the patient and family. After consideration of risks, benefits and other options for treatment, the patient has consented to  Procedure(s): COLONOSCOPY WITH PROPOFOL (N/A) as a surgical intervention.  The patient's history has been reviewed, patient examined, no change in status, stable for surgery.  I have reviewed the patient's chart and labs.  Questions were answered to the patient's satisfaction.     [place], [place]

## 2024-01-19 NOTE — Op Note (Signed)
 [facility] Patient Name: Brittany Phillips Procedure Date: 01/19/2024 10:08 AM MRN: [id number] Account #: [id number] Date of Birth: 1941-11-11 Admit Type: Outpatient Age: 83 Room: [facility] ENDO ROOM 2 Gender: Female Note Status: Finalized Instrument Name: Peds Colonoscope [id number] Procedure:             Colonoscopy Indications:           Epigastric abdominal pain, Abnormal CT of the GI                         tract, Weight loss Providers:             Boykin Nearing. Norma Fredrickson MD, MD Referring MD:          Danella Penton, MD (Referring MD) Medicines:             Propofol per Anesthesia Complications:         No immediate complications. Estimated blood loss: None. Procedure:             Pre-Anesthesia Assessment:                        - The risks and benefits of the procedure and the                         sedation options and risks were discussed with the                         patient. All questions were answered and informed                         consent was obtained.                        - Patient identification and proposed procedure were                         verified prior to the procedure by the nurse. The                         procedure was verified in the procedure room.                        - ASA Grade Assessment: III - A patient with severe                         systemic disease.                        - After reviewing the risks and benefits, the patient                         was deemed in satisfactory condition to undergo the                         procedure.                        After obtaining informed consent, the colonoscope was  passed under direct vision. Throughout the procedure,                         the patient's blood pressure, pulse, and oxygen                         saturations were monitored continuously. The                         Colonoscope was introduced through the anus and                          advanced to the the cecum, identified by appendiceal                         orifice and ileocecal valve. The colonoscopy was                         performed without difficulty. The patient tolerated                         the procedure well. The quality of the bowel                         preparation was good. The ileocecal valve, appendiceal                         orifice, and rectum were photographed. Findings:      External and internal hemorrhoids were found during retroflexion and       during perianal exam. The hemorrhoids were Grade III (internal       hemorrhoids that prolapse but require manual reduction).      A 9 mm polyp was found in the ascending colon. The polyp was sessile.       The polyp was removed with a hot snare. Resection and retrieval were       complete.      An 11 mm polyp was found in the proximal rectum. The polyp was sessile.       The polyp was removed with a hot snare. Resection and retrieval were       complete.      The exam was otherwise without abnormality. Impression:            - External and internal hemorrhoids.                        - One 9 mm polyp in the ascending colon, removed with                         a hot snare. Resected and retrieved.                        - One 11 mm polyp in the proximal rectum, removed with                         a hot snare. Resected and retrieved.                        - The examination was otherwise normal.                        -  There was specifically NO evidence of inflammatory                         change in the cecum as suggested by CT scan results. Recommendation:        - Patient has a contact number available for                         emergencies. The signs and symptoms of potential                         delayed complications were discussed with the patient.                         Return to normal activities tomorrow. Written                         discharge instructions were  provided to the patient.                        - Resume previous diet.                        - Continue present medications.                        - If polyps are benign or adenomatous without                         dysplasia, I will advise NO further colonoscopy due to                         advanced age and/or severe comorbidity.                        - Follow up with Tawni Pummel, PA-C at [facility]. [phone].                        - Telephone GI office to schedule appointment in 3                         months.                        - The findings and recommendations were discussed with                         the patient. Procedure Code(s):     --- Professional ---                        [phone], Colonoscopy, flexible; with removal of                         tumor(s), polyp(s), or other lesion(s) by snare                         technique Diagnosis Code(s):     ---  Professional ---                        R93.3, Abnormal findings on diagnostic imaging of                         other parts of digestive tract                        R63.4, Abnormal weight loss                        R10.13, Epigastric pain                        K64.2, Third degree hemorrhoids                        D12.8, Benign neoplasm of rectum                        D12.2, Benign neoplasm of ascending colon CPT copyright 2022 American Medical Association. All rights reserved. The codes documented in this report are preliminary and upon coder review may  be revised to meet current compliance requirements. [name] MD, MD 01/19/2024 11:27:46 AM This report has been signed electronically. Number of Addenda: 0 Note Initiated On: 01/19/2024 10:08 AM Scope Withdrawal Time: 0 hours 9 minutes 36 seconds  Total Procedure Duration: 0 hours 14 minutes 9 seconds  Estimated Blood Loss:  Estimated blood loss: none.      [facility]

## 2024-01-19 NOTE — Anesthesia Preprocedure Evaluation (Signed)
 Anesthesia Evaluation  Patient identified by MRN, date of birth, ID band Patient awake    Reviewed: Allergy & Precautions, NPO status , Patient's Chart, lab work & pertinent test results  History of Anesthesia Complications Negative for: history of anesthetic complications  Airway Mallampati: III  TM Distance: <3 FB Neck ROM: full    Dental  (+) Chipped, Poor Dentition, Missing   Pulmonary neg pulmonary ROS, neg shortness of breath   Pulmonary exam normal        Cardiovascular Exercise Tolerance: Good hypertension, Normal cardiovascular exam+ dysrhythmias      Neuro/Psych  Headaches TIACVA  negative psych ROS   GI/Hepatic Neg liver ROS, hiatal hernia,GERD  Controlled,,  Endo/Other  Hypothyroidism    Renal/GU Renal disease  negative genitourinary   Musculoskeletal   Abdominal   Peds  Hematology negative hematology ROS (+)   Anesthesia Other Findings Past Medical History: No date: Anemia No date: Anxiety No date: Cervicalgia No date: Depression No date: Dysrhythmia No date: GERD (gastroesophageal reflux disease) No date: Headache No date: History of hiatal hernia No date: Hyperlipidemia No date: Hyperlipidemia No date: Hypertension No date: Loss of hearing     Comment:  aids No date: Lumbar disc disease No date: Migraines No date: Neuralgia No date: Post menopausal problems No date: PVC's (premature ventricular contractions) No date: Rosacea No date: Stroke [facility]) 2017: TIA (transient ischemic attack) No date: Vitamin D deficiency No date: Wears hearing aid in both ears  Past Surgical History: No date: ABDOMINAL HYSTERECTOMY No date: BUNIONECTOMY 02/22/2018: CATARACT EXTRACTION W/PHACO; Right     Comment:  Procedure: CATARACT EXTRACTION PHACO AND INTRAOCULAR               LENS PLACEMENT (IOC) RIGHT;  Surgeon: Nevada Crane,              MD;  Location: [facility];  Service:                Ophthalmology;  Laterality: Right; 03/29/2018: CATARACT EXTRACTION W/PHACO; Left     Comment:  Procedure: CATARACT EXTRACTION PHACO AND INTRAOCULAR               LENS PLACEMENT (IOC) LEFT IVA TOPICAL;  Surgeon: Nevada Crane, MD;  Location: [facility];                Service: Ophthalmology;  Laterality: Left; No date: CHOLECYSTECTOMY 06/27/2015: COLONOSCOPY WITH PROPOFOL; N/A     Comment:  Procedure: COLONOSCOPY WITH PROPOFOL;  Surgeon: Scot Jun, MD;  Location: [facility] ENDOSCOPY;  Service:               Endoscopy;  Laterality: N/A; No date: COLONOSCOPY, ESOPHAGOGASTRODUODENOSCOPY (EGD) AND ESOPHAGEAL  DILATION 12/09/2022: ESOPHAGOGASTRODUODENOSCOPY; N/A     Comment:  Procedure: ESOPHAGOGASTRODUODENOSCOPY (EGD);  Surgeon:               [place], Boykin Nearing, MD;  Location: [facility] ENDOSCOPY;                Service: Gastroenterology;  Laterality: N/A; 06/27/2015: ESOPHAGOGASTRODUODENOSCOPY (EGD) WITH PROPOFOL; N/A     Comment:  Procedure: ESOPHAGOGASTRODUODENOSCOPY (EGD) WITH               PROPOFOL;  Surgeon: Scot Jun, MD;  Location: [facility]  ENDOSCOPY;  Service: Endoscopy;  Laterality: N/A; 08/06/2022: GANGLION CYST EXCISION; Right     Comment:  Procedure: REMOVAL GANGLION CYST FOOT;  Surgeon: Rosetta Posner, DPM;  Location: [facility];  Service:               Podiatry;  Laterality: Right;  Anesthesia: MAC w/ Local 09/21/2022: INCISION AND DRAINAGE OF WOUND; Left     Comment:  Procedure: IRRIGATION AND DEBRIDEMENT HEMATOMA;                Surgeon: Lewie Chamber, DO;  Location: [facility];               Service: General;  Laterality: Left; 06/27/2015: SAVORY DILATION     Comment:  Procedure: SAVORY DILATION;  Surgeon: Scot Jun,               MD;  Location: [facility] ENDOSCOPY;  Service: Endoscopy;; No date: TONSILLECTOMY  BMI    Body Mass Index: 22.50 kg/m      Reproductive/Obstetrics negative OB  ROS                             Anesthesia Physical Anesthesia Plan  ASA: 3  Anesthesia Plan: General   Post-op Pain Management:    Induction: Intravenous  PONV Risk Score and Plan: Propofol infusion and TIVA  Airway Management Planned: Natural Airway and Nasal Cannula  Additional Equipment:   Intra-op Plan:   Post-operative Plan:   Informed Consent: I have reviewed the patients History and Physical, chart, labs and discussed the procedure including the risks, benefits and alternatives for the proposed anesthesia with the patient or authorized representative who has indicated his/her understanding and acceptance.     Dental Advisory Given  Plan Discussed with: Anesthesiologist, CRNA and Surgeon  Anesthesia Plan Comments: (Patient consented for risks of anesthesia including but not limited to:  - adverse reactions to medications - risk of airway placement if required - damage to eyes, teeth, lips or other oral mucosa - nerve damage due to positioning  - sore throat or hoarseness - Damage to heart, brain, nerves, lungs, other parts of body or loss of life  Patient voiced understanding and assent.)       Anesthesia Quick Evaluation

## 2024-01-19 NOTE — H&P (Signed)
 Outpatient short stay form Pre-procedure 01/19/2024 10:06 AM Brittany Phillips K. Brittany Phillips, M.D.  Primary Physician: Bethann Punches, M.D.  Reason for visit:    History of present illness:  Upper abdominal pain/wt loss-reports it mainly occurs after eating. Does report some occasional fear of eating due to symptoms. EGD February 2024 was fairly unremarkable, also had small bowel follow-through for evaluation. Will check basic labs and CT, will need to check noncontrast CT given creatinine 2.0. She feels that she can have some spasm in abd with pain and will try antispasmodic. If CT is negative would consider mesenteric evaluation to exclude mesenteric ischemia. She is s/p CCY.   CT scan of abdomen and pelvis was ordered on 01/09/24 with the following results: 1. Submucosal fibrofatty infiltration of the cecum and ascending colon with minimal adjacent fat stranding, which can be seen in the setting of chronic inflammatory bowel disease. 2. Scattered sigmoid colonic diverticulosis without findings of acute diverticulitis. 3.  Aortic Atherosclerosis (ICD10-I70.0).   Current Facility-Administered Medications:    0.9 %  sodium chloride infusion, , Intravenous, Continuous, Ashanti Ratti, Boykin Nearing, MD  Medications Prior to Admission  Medication Sig Dispense Refill Last Dose/Taking   Cholecalciferol (VITAMIN D3) 2000 units TABS Take 2,000 Units by mouth daily.   Past Week   clonazePAM (KLONOPIN) 0.25 MG disintegrating tablet Take 0.25 mg by mouth 2 (two) times daily.   01/18/2024   aspirin EC 81 MG tablet Take 1 tablet (81 mg total) by mouth at bedtime. 30 tablet 11 01/17/2024   estradiol (ESTRACE) 1 MG tablet Take 1 mg by mouth daily.   01/17/2024 Morning   gabapentin (NEURONTIN) 100 MG capsule Take 200 mg by mouth 2 (two) times daily.      levothyroxine (SYNTHROID) 50 MCG tablet Take 50 mcg by mouth daily before breakfast.   01/17/2024   Menthol-Methyl Salicylate (MUSCLE RUB) 10-15 % CREA Apply 1 Application topically in the  morning and at bedtime.      pantoprazole (PROTONIX) 40 MG tablet Take 40 mg by mouth 2 (two) times daily.   01/17/2024   polyvinyl alcohol (LIQUIFILM TEARS) 1.4 % ophthalmic solution Place 1 drop into both eyes in the morning and at bedtime.      propranolol (INDERAL) 40 MG tablet Take 40-80 mg by mouth See admin instructions. Take 80 mg by mouth in the morning and 40 mg at night   01/17/2024   sucralfate (CARAFATE) 1 g tablet Take 1 g by mouth 3 (three) times daily with meals.   01/17/2024   triamterene-hydrochlorothiazide (DYAZIDE) 37.5-25 MG capsule Take 2 capsules by mouth daily.   01/17/2024   vitamin B-12 (CYANOCOBALAMIN) 1000 MCG tablet Take 1,000 mcg by mouth daily.        Allergies  Allergen Reactions   Peanut-Containing Drug Products Anaphylaxis   Ace Inhibitors Cough   Cephalexin Other (See Comments)    GI upset   Hydrocodone-Acetaminophen Nausea Only   Tizanidine Itching   Wild Lettuce Extract (Lactuca Virosa) Swelling   Codeine Hives and Other (See Comments)    Reaction:  Weakness      Metoclopramide Hives and Other (See Comments)    Caused irregular hand movements     Past Medical History:  Diagnosis Date   Anemia    Anxiety    Cervicalgia    Depression    Dysrhythmia    GERD (gastroesophageal reflux disease)    Headache    History of hiatal hernia    Hyperlipidemia    Hyperlipidemia  Hypertension    Loss of hearing    aids   Lumbar disc disease    Migraines    Neuralgia    Post menopausal problems    PVC's (premature ventricular contractions)    Rosacea    Stroke (HCC)    TIA (transient ischemic attack) 2017   Vitamin D deficiency    Wears hearing aid in both ears     Review of systems:  Otherwise negative.    Physical Exam  Gen: Alert, oriented. Appears stated age.  HEENT: [place]/AT. PERRLA. Lungs: CTA, no wheezes. CV: RR nl S1, S2. Abd: soft, benign, no masses. BS+ Ext: No edema. Pulses 2+    Planned procedures: Proceed with colonoscopy for  abdominal pain and abnormal CT of the Colon. The patient understands the nature of the planned procedure, indications, risks, alternatives and potential complications including but not limited to bleeding, infection, perforation, damage to internal organs and possible oversedation/side effects from anesthesia. The patient agrees and gives consent to proceed.  Please refer to procedure notes for findings, recommendations and patient disposition/instructions.     Joseph Bias K. Brittany Phillips, M.D. Gastroenterology 01/19/2024  10:06 AM

## 2024-01-20 ENCOUNTER — Encounter: Payer: Self-pay | Admitting: Internal Medicine

## 2024-01-21 LAB — SURGICAL PATHOLOGY

## 2024-06-28 NOTE — Progress Notes (Signed)
 No chief complaint on file.   HPI  Brittany Phillips is a 83 y.o. here for an acute issue.  She has a PMH of lower extremity edema, HLD, anemia, GERD who presents with feeling off balance, dizzy for the last several days.  Very busy week last week with vacation [employer].  Feels her mind is not as sharp.  Still feels comfortable driving.  No nausea or vomiting or diarrhea.  No chest pain or shortness of breath.  No dysuria.  Still with lower extremity edema.  Was on her diuretic last week and discontinued to see if that would help but not seeing improvement.    ROS  Pertinent items are noted in HPI.  Outpatient Encounter Medications as of 06/28/2024  Medication Sig Dispense Refill  . aspirin  81 MG EC tablet Take by mouth    . cholecalciferol (VITAMIN D3) 2,000 unit capsule Take 2,000 Units by mouth once daily.    . clonazePAM (KLONOPIN) 0.25 MG disintegrating tablet TAKE 1 TABLET BY MOUTH 2 TIMES DAILY. 60 tablet 5  . estradioL (ESTRACE) 1 MG tablet Take 1 tablet (1 mg total) by mouth once daily 90 tablet 3  . fluticasone propionate (FLONASE) 50 mcg/actuation nasal spray Place 2 sprays into both nostrils once daily    . gabapentin (NEURONTIN) 100 MG capsule Take 2 capsules (200 mg total) by mouth 3 (three) times daily 360 capsule 5  . hyoscyamine (LEVSIN/SL) 0.125 mg SL tablet Place 1 tablet (0.125 mg total) under the tongue every 6 (six) hours as needed (abd pain) 30 tablet 1  . Lactobacillus acidophilus (PROBIOTIC ORAL) Take 1 tablet by mouth once daily    . levothyroxine (SYNTHROID) 50 MCG tablet Take 1 tablet (50 mcg total) by mouth once daily Take on an empty stomach with a glass of water at least 30-60 minutes before breakfast. 90 tablet 3  . pantoprazole (PROTONIX) 40 MG DR tablet Take 1 tablet (40 mg total) by mouth 2 (two) times daily Take 30 min before meals 180 tablet 2  . polyvinyl alcohol (LIQUITEARS) 1.4 % ophthalmic solution Apply to eye    . propranoloL (INDERAL) 40 MG tablet  TAKE 1 TABLET BY MOUTH 3 TIMES DAILY. 270 tablet 3  . sucralfate (CARAFATE) 1 gram tablet Take 1 tablet (1 g total) by mouth 3 (three) times daily before meals 90 tablet 3  . traMADoL  (ULTRAM ) 50 mg tablet Take 0.5 tablets (25 mg total) by mouth 2 (two) times daily as needed for Pain 30 tablet 5  . traMADoL  (ULTRAM ) 50 mg tablet Take 0.5 tablets (25 mg total) by mouth 2 (two) times daily as needed for Pain TAKE HALF A TABLET BY MOUTH DAILY 60 tablet 5  . triamterene-hydroCHLOROthiazide (DYAZIDE) 37.5-25 mg capsule TAKE 1 TO 2 TABLETS DAILY 180 capsule 3   No facility-administered encounter medications on file as of 06/28/2024.    Allergies as of 06/28/2024 - Reviewed 06/15/2024  Allergen Reaction Noted  . Peanut Anaphylaxis 04/25/2021  . Ace inhibitors Cough 05/21/2016  . Codeine sulfate Other (See Comments) 04/27/2014  . Hydrocodone -acetaminophen  Nausea 07/29/2021  . Keflex  [cephalexin ] Other (See Comments) 08/01/2020  . Lactuca virosa Swelling 06/17/2021  . Lettuce Swelling 07/23/2021  . Reglan [metoclopramide] Other (See Comments) 03/05/2020  . Zanaflex [tizanidine] Hallucination 03/13/2019    Past Medical History:  Diagnosis Date  . Anemia, unspecified   . Cervicalgia   . Enthesopathy of hip region   . Esophageal reflux   . History of migraine headaches   .  Hyperlipidemia 06/01/2014  . Iron deficiency anemia, unspecified    heme-negative stool  . Lumbar disc disease 06/01/2014  . Migraine headache 06/01/2014  . Neuralgia, neuritis, and radiculitis, unspecified   . Other and unspecified hyperlipidemia   . Postmenopausal state   . PVC (premature ventricular contraction) 06/01/2014  . Vitamin D deficiency disease 06/01/2014    Past Surgical History:  Procedure Laterality Date  . SIGMOIDOSCOPY FLEXIBLE  07/05/1992  . COLONOSCOPY  03/04/2001   [facility] (Father)  . EGD  03/04/2001  . COLONOSCOPY  06/23/2010   [facility] (Father): CBF 06/24/2015; OV made 05/13/2015 @ 10:30am w/Kim Moishe NP  (dw)  . EGD  06/23/2010   No repeat per RTE  . COLONOSCOPY  06/27/2015   [facility] (Father): CBF 06/2020  . EGD  06/27/2015   No repeat per RTE  . EGD  02/10/2021   Gastritis/Esophagitis/No repeat/TKT  . OTHER SURGERY  09/21/2022    Debridement  of left leg.  . EGD @ [facility]  12/09/2022   Fundic gland polyps/Gastritis/No repeat/TKT  . Colon @ [facility]  01/19/2024   Tubular adenoma/FHx CC/No repeat due to age/TKT  . CATARACT EXTRACTION Bilateral   . CHOLECYSTECTOMY    . HYSTERECTOMY    . OTHER SURGERY     tENDON SURGERY RIGHT HAND.  . Right bunion    . TONSILLECTOMY     with adenoidectomy    There were no vitals filed for this visit.  Physical Exam  General. Well appearing; NAD; VS reviewed     HEENT: Sclera and conjunctiva clear; EOMI,  Neck. Supple. No thyromegaly, lymphadenopathy. Lungs. Respirations unlabored; clear to auscultation bilaterally. Cardiovascular. Heart regular rate and rhythm without murmurs, gallops, or rubs. Extremities:  1-2+ edema. Skin. Normal color and turgor Neurologic. Alert and oriented x3   Assessment and Plan 83 year old with above PMH who presents with weakness, dizziness worse over the last 4 days.  Check labs for dehydration.  Consider UTI.  Follow-up depending on results.  1. Dizziness  -     Urinalysis w/Microscopic -     Urine Culture, Routine - [facility] -     CBC w/auto Differential (5 Part) -     Comprehensive Metabolic Panel (CMP)  2. Weakness generalized  -     Urinalysis w/Microscopic -     Urine Culture, Routine - [facility] -     CBC w/auto Differential (5 Part) -     Comprehensive Metabolic Panel (CMP)  3. Bilateral lower extremity edema   4. Stage 3b chronic kidney disease (CMS/HHS-HCC)     I have personally performed this service.  [address] [place], [place]

## 2024-06-29 ENCOUNTER — Ambulatory Visit: Attending: Orthopedic Surgery | Admitting: Occupational Therapy

## 2024-06-29 DIAGNOSIS — M6281 Muscle weakness (generalized): Secondary | ICD-10-CM | POA: Insufficient documentation

## 2024-06-29 DIAGNOSIS — M25542 Pain in joints of left hand: Secondary | ICD-10-CM | POA: Diagnosis present

## 2024-06-29 DIAGNOSIS — M25642 Stiffness of left hand, not elsewhere classified: Secondary | ICD-10-CM | POA: Insufficient documentation

## 2024-06-29 NOTE — Therapy (Signed)
 OUTPATIENT OCCUPATIONAL THERAPY ORTHO EVALUATION  Patient Name: Brittany Phillips MRN: [id number] DOB:May 14, 1941, 83 y.o., female   PCP: Brittany Anes, MD REFERRING PROVIDER: Kathlynn Sharper, MD  END OF SESSION:  OT End of Session - 07/08/24 2023     Visit Number 1    Number of Visits 13    Date for OT Re-Evaluation 08/11/24    OT Start Time 1313    OT Stop Time 1400    OT Time Calculation (min) 47 min    Activity Tolerance Patient tolerated treatment well          Past Medical History:  Diagnosis Date   Anemia    Anxiety    Cervicalgia    Depression    Dysrhythmia    GERD (gastroesophageal reflux disease)    Headache    History of hiatal hernia    Hyperlipidemia    Hyperlipidemia    Hypertension    Loss of hearing    aids   Lumbar disc disease    Migraines    Neuralgia    Post menopausal problems    PVC's (premature ventricular contractions)    Rosacea    Stroke (HCC)    TIA (transient ischemic attack) 2017   Vitamin D deficiency    Wears hearing aid in both ears    Past Surgical History:  Procedure Laterality Date   ABDOMINAL HYSTERECTOMY     BUNIONECTOMY     CATARACT EXTRACTION W/PHACO Right 02/22/2018   Procedure: CATARACT EXTRACTION PHACO AND INTRAOCULAR LENS PLACEMENT (IOC) RIGHT;  Surgeon: Brittany Adine Anes, MD;  Location: [facility];  Service: Ophthalmology;  Laterality: Right;   CATARACT EXTRACTION W/PHACO Left 03/29/2018   Procedure: CATARACT EXTRACTION PHACO AND INTRAOCULAR LENS PLACEMENT (IOC) LEFT IVA TOPICAL;  Surgeon: Brittany Adine Anes, MD;  Location: [facility];  Service: Ophthalmology;  Laterality: Left;   CHOLECYSTECTOMY     COLONOSCOPY WITH PROPOFOL  N/A 06/27/2015   Procedure: COLONOSCOPY WITH PROPOFOL ;  Surgeon: Brittany ONEIDA Holmes, MD;  Location: [facility];  Service: Endoscopy;  Laterality: N/A;   COLONOSCOPY WITH PROPOFOL  N/A 01/19/2024   Procedure: COLONOSCOPY WITH PROPOFOL ;  Surgeon: Toledo, Ladell POUR, MD;  Location:  [facility] END[facility];  Service: Gastroenterology;  Laterality: N/A;   COLONOSCOPY, ESOPHAGOGASTRODUODENOSCOPY (EGD) AND ESOPHAGEAL DILATION     ESOPHAGOGASTRODUODENOSCOPY N/A 12/09/2022   Procedure: ESOPHAGOGASTRODUODENOSCOPY (EGD);  Surgeon: Toledo, Ladell POUR, MD;  Location: [facility] END[facility];  Service: Gastroenterology;  Laterality: N/A;   ESOPHAGOGASTRODUODENOSCOPY (EGD) WITH PROPOFOL  N/A 06/27/2015   Procedure: ESOPHAGOGASTRODUODENOSCOPY (EGD) WITH PROPOFOL ;  Surgeon: Brittany ONEIDA Holmes, MD;  Location: [facility] END[facility];  Service: Endoscopy;  Laterality: N/A;   GANGLION CYST EXCISION Right 08/06/2022   Procedure: REMOVAL GANGLION CYST FOOT;  Surgeon: Brittany Phillips, DPM;  Location: [facility];  Service: Podiatry;  Laterality: Right;  Anesthesia: MAC w/ Local   INCISION AND DRAINAGE OF WOUND Left 09/21/2022   Procedure: IRRIGATION AND DEBRIDEMENT HEMATOMA;  Surgeon: Brittany Dorothyann LABOR, DO;  Location: [facility];  Service: General;  Laterality: Left;   POLYPECTOMY  01/19/2024   Procedure: POLYPECTOMY;  Surgeon: Aundria, Ladell POUR, MD;  Location: [facility] END[facility];  Service: Gastroenterology;;   HARLEY DILATION  06/27/2015   Procedure: HARLEY DILATION;  Surgeon: Brittany ONEIDA Holmes, MD;  Location: [facility];  Service: Endoscopy;;   TONSILLECTOMY     Patient Active Problem List   Diagnosis Date Noted   Stage 3b chronic kidney disease (HCC) 01/27/2022   Cervical disc disorder with radiculopathy 09/03/2021   Pain  in right hand 05/13/2020   Acquired hypothyroidism 06/25/2017   B12 deficiency 06/25/2017   Benign essential hypertension 12/14/2016   Low serum vitamin D 12/14/2016   Medicare annual wellness visit, initial 12/14/2016   Hyperlipidemia, mixed 02/03/2016   Hemispheric carotid artery syndrome 02/03/2016   PVC (premature ventricular contraction) 06/01/2014   Lumbar disc disease 06/01/2014   GERD (gastroesophageal reflux disease) 06/01/2014    ONSET DATE: 06/15/2024  REFERRING DIAG:  nontraumatic subluxation of extensor tendon at metacarpophalangeal joint of the left hand.  THERAPY DIAG:  Pain in joint of left hand  Muscle weakness (generalized)  Stiffness of left hand, not elsewhere classified  Rationale for Evaluation and Treatment: Rehabilitation  SUBJECTIVE:   SUBJECTIVE STATEMENT: Pt reports she is feeling dehydrated.  She reports the left hand is doing better but right hand is worse than left.  She reports decreased pain and feeling better over the last 3-4 weeks.  Pt has a history of CT surgery on her right  in 2021.   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 She has experienced finger deformities for a couple of years, with recent onset of pain in her hands. She describes a tearing sensation in her middle finger when lifting or picking up objects, causing significant discomfort throughout the day. Both her ring and middle fingers sublux, and she experiences tenderness in her hands. She reports that her fingers may pop, but do not lock in place.She previously underwent surgery on one hand for a similar issue in [place], though details of the procedure are unavailable. The condition has worsened significantly in the last month, affecting her ability to perform activities such as cutting and chopping vegetables. Despite this, she has managed to complete tasks like preparing beans and corn for storage.  She reports consistent swelling in her middle finger, describing it as fluid-filled, similar to a ganglion. No issues with her wrist or elbow are noted. 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 This condition has persisted  for several years, with a recent exacerbation. Clinical presentation and x-ray findings, including erosive changes and ulnar deviation, suggest rheumatoid arthritis. Blood work will confirm the diagnosis. Kidney dysfunction complicates treatment, limiting anti-inflammatory medication use. Order blood work to test for rheumatoid arthritis. Refer to hand therapy for splinting and occupational therapy. Schedule a hand specialist consultation next month. Consider a rheumatology referral based on lab results.  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  PRECAUTIONS: None  RED FLAGS: Pt recently with dehydration, arthritic changes in hands    WEIGHT BEARING RESTRICTIONS: No  PAIN:  Are you having pain? Yes: NPRS scale: 5 Pain location: right hand, left hand Pain description: aching, some sharp pains at times Aggravating factors: with use  Relieving factors: rest, voltaran gel   FALLS: Has patient fallen in last 6 months? Yes. Number of falls 2, the other day, dizziness with dehydration and also 1 fall in the tub  LIVING ENVIRONMENT: Lives with: lives with their spouse Lives in: Mobile home Stairs: No Has following equipment at home: Single point cane, Environmental consultant - 2 wheeled, shower chair, and Grab bars  PLOF: Independent  PATIENT GOALS: Pt would like to be able to use bilateral hands for daily tasks, would like to stop dropping items and be able to can and put up her vegetables that come in season.    NEXT MD VISIT: unsure of date  OBJECTIVE:  Note: Objective measures were completed at Evaluation unless otherwise noted.  HAND DOMINANCE: Right  ADLs: Overall ADLs: Independent with self care tasks, husband helps with sweeping, dishes and heavier household chores. Pt reports she loves to cook which is a big part of her day.  She has a garden in which she grows a variety of vegetables.  She recently worked to put up [employer]  and had difficulty with cutting/chopping and freezing it.  She also needs to be able to put up green beans which involves snapping which she can no longer perform.  She cut the last batch.  She reports switching from glass bowls which are hard to manage to plastic ones.  She is able to drive but reports increased pain 5/10 with grippng the steering wheel.  She has difficulty with donning shoes and tying so she wears slip on shoes.  She typically performs a sponge bath lately.  Pt also wears thin silicone non slip gloves to be able to hold items.     FUNCTIONAL OUTCOME MEASURES: TBA  UPPER EXTREMITY ROM:     Active ROM Right eval Left eval  Shoulder flexion 128 125  Shoulder abduction 150 150  Shoulder adduction    Shoulder extension    Shoulder internal rotation    Shoulder external rotation    Elbow flexion 130 130  Elbow extension 0 0  Wrist flexion 75 80  Wrist extension 70 55  Wrist ulnar deviation 40 40  Wrist radial deviation 6 4  Wrist pronation Full  full  Wrist supination Full  Full   (Blank rows = not tested)  Active ROM Right eval Left eval  Thumb MCP (0-60) 55 40  Thumb IP (0-80) 80 75  Thumb Radial abd/add (0-55)     Thumb Palmar abd/add (0-45)     Thumb Opposition to Small Finger intact  intact  Index MCP (0-90)     Index PIP (0-100)     Index DIP (0-70)      Long MCP (0-90)      Long PIP (0-100)      Long DIP (0-70)      Ring MCP (0-90)      Ring PIP (0-100)      Ring DIP (0-70)      Little MCP (0-90)      Little PIP (0-100)      Little DIP (0-70)      (Blank rows = not tested)  Full fisting bilaterally, ulnar drift   UPPER EXTREMITY MMT:     MMT Right eval Left eval  Shoulder flexion    Shoulder abduction    Shoulder adduction    Shoulder extension    Shoulder internal rotation    Shoulder external rotation    Middle trapezius    Lower trapezius    Elbow flexion    Elbow extension    Wrist flexion    Wrist extension    Wrist ulnar  deviation    Wrist radial deviation    Wrist pronation    Wrist supination    (Blank rows = not tested)  HAND FUNCTION: Grip strength: Right: 22 lbs; Left: 27 lbs, Lateral pinch: Right: 6 lbs, Left: 6  lbs, and 3 point pinch: Right: 4 lbs, Left: 4 lbs  COORDINATION: Impaired speed and coordination of movements.  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  She reports popping at times in the left MF but denies any locking.  Pt with a cyst on the MP of Left MF. No tenderness noted at A1 pulley today.  No pain reported at rest but has 5/10 pain with driving today.    TREATMENT DATE: 06/29/24                                                                                                                           Modalities: Contrast bath:  Time: 8 mins  Location: bilateral hands/wrists To decrease pain, increase tissue mobility and increase ROM.   ROM following contrast  Recommended steering wheel cover for increased surface grip to decrease pain. Discussed additional recommendations for joint protection with kitchen tasks.   PATIENT EDUCATION: Education details: use of contrast for edema management,  Person educated: Patient Education method: Medical illustrator Education comprehension: verbalized understanding  HOME EXERCISE PROGRAM: Will provide a more comprehensive program next session.    GOALS: Goals reviewed with patient? Yes  SHORT TERM GOALS: Target date: 07/21/2024   Pt will demonstrate understanding of use of contrast for edema control in bilateral UEs.   Baseline: no current knowledge Goal status: INITIAL  LONG TERM GOALS: Target date: 08/11/2024  Pt to demonstrate understanding and implementation of joint protection principles into daily routine Baseline: limited knowledge at eval. Goal status:  INITIAL  2.  Pt will demonstrate improved ROM of left thumb by 10 degrees to hold objects with secure grip with minimal dropping of items. Baseline: decreased ROM at left thumb in IP and MP and drops items frequently. Goal status: INITIAL  3.  Pt will demonstrate decreased pain 2/10 with modifications to driving. Baseline: increased pain with driving 4/89 with gripping steering wheel.   Goal status: INITIAL  4.  Pt will demonstrate donning and doffing of splints with modified independence.   Baseline: no splints currently Goal status: INITIAL  5.  Pt will demonstrate knowledge and implement strategies to assist with canning and freezing of garden vegetables following joint protection principles.  Baseline: limited knowledge of joint protection principles. Goal status: INITIAL   ASSESSMENT:  CLINICAL IMPRESSION: Patient is a 83 y.o. 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 Pt has a history of similar issues with the right hand in the past with surgical intervention.  Pt demonstrates hand weakness, decreased strength, decreased range of motion, pain and decreased ability to perform necessary daily tasks.  Pt reports difficulty with holding items, cutting, handwriting, folding laundry, chopping and freezing in season vegetables from her garden and pain with driving.  She would  benefit from skilled Occupational Therapy to maximize safety and independence in necessary daily tasks.    PERFORMANCE DEFICITS: in functional skills including ADLs, IADLs, coordination, dexterity, edema, ROM, strength, pain, flexibility, and UE functional use, and psychosocial skills including environmental adaptation, habits, and routines and behaviors.   IMPAIRMENTS: are limiting patient from ADLs, IADLs, and leisure.   COMORBIDITIES: may have co-morbidities  that affects occupational performance.  Patient will benefit from skilled OT to address above impairments and improve overall function.  MODIFICATION OR ASSISTANCE TO COMPLETE EVALUATION: Min-Moderate modification of tasks or assist with assess necessary to complete an evaluation.  OT OCCUPATIONAL PROFILE AND HISTORY: Detailed assessment: Review of records and additional review of physical, cognitive, psychosocial history related to current functional performance.  CLINICAL DECISION MAKING: Moderate - several treatment options, min-mod task modification necessary  REHAB POTENTIAL: Good  EVALUATION COMPLEXITY: Moderate      PLAN:  OT FREQUENCY: 1-2x/week  OT DURATION: 6 weeks  PLANNED INTERVENTIONS: 97168 OT Re-evaluation, 97535 self care/ADL training, 02889 therapeutic exercise, 97530 therapeutic activity, 97112 neuromuscular re-education, 97140 manual therapy, 97035 ultrasound, 97018 paraffin, 02960 fluidotherapy, 97010 moist heat, 97010 cryotherapy, 97034 contrast bath, 97033 iontophoresis, 97760 Orthotic Initial, 97763 Orthotic/Prosthetic subsequent, patient/family education, and DME and/or AE instructions  RECOMMENDED OTHER SERVICES: none  CONSULTED AND AGREED WITH PLAN OF CARE: Patient  PLAN FOR NEXT SESSION: assess for splints next session  Demon Volante T Sofia Jaquith, OTR/L, CLT Harla Mensch, OT 07/08/2024, 9:10 PM

## 2024-07-03 ENCOUNTER — Ambulatory Visit: Admitting: Occupational Therapy

## 2024-07-18 ENCOUNTER — Ambulatory Visit: Attending: Orthopedic Surgery | Admitting: Occupational Therapy

## 2024-07-18 DIAGNOSIS — M25542 Pain in joints of left hand: Secondary | ICD-10-CM | POA: Insufficient documentation

## 2024-07-18 DIAGNOSIS — M6281 Muscle weakness (generalized): Secondary | ICD-10-CM | POA: Insufficient documentation

## 2024-07-18 DIAGNOSIS — M25642 Stiffness of left hand, not elsewhere classified: Secondary | ICD-10-CM | POA: Insufficient documentation

## 2024-07-20 ENCOUNTER — Encounter: Admitting: Occupational Therapy

## 2024-08-04 ENCOUNTER — Ambulatory Visit: Admitting: Occupational Therapy

## 2024-08-04 DIAGNOSIS — M25642 Stiffness of left hand, not elsewhere classified: Secondary | ICD-10-CM

## 2024-08-04 DIAGNOSIS — M6281 Muscle weakness (generalized): Secondary | ICD-10-CM | POA: Diagnosis present

## 2024-08-04 DIAGNOSIS — M25542 Pain in joints of left hand: Secondary | ICD-10-CM

## 2024-08-04 NOTE — Therapy (Signed)
 OUTPATIENT OCCUPATIONAL THERAPY ORTHO TREATMENT  Patient Name: Brittany Phillips MRN: [id number] DOB:26-Jul-1941, 83 y.o., female   PCP: Brittany Anes, Phillips REFERRING PROVIDER: Kathlynn Sharper, Phillips  END OF SESSION:  OT End of Session - 08/04/24 0820     Visit Number 2    Number of Visits 13    Date for Recertification  08/11/24    OT Start Time 0820    OT Stop Time 0908    OT Time Calculation (min) 48 min    Activity Tolerance Patient tolerated treatment well    Behavior During Therapy WFL for tasks assessed/performed          Past Medical History:  Diagnosis Date   Anemia    Anxiety    Cervicalgia    Depression    Dysrhythmia    GERD (gastroesophageal reflux disease)    Headache    History of hiatal hernia    Hyperlipidemia    Hyperlipidemia    Hypertension    Loss of hearing    aids   Lumbar disc disease    Migraines    Neuralgia    Post menopausal problems    PVC's (premature ventricular contractions)    Rosacea    Stroke (HCC)    TIA (transient ischemic attack) 2017   Vitamin D deficiency    Wears hearing aid in both ears    Past Surgical History:  Procedure Laterality Date   ABDOMINAL HYSTERECTOMY     BUNIONECTOMY     CATARACT EXTRACTION W/PHACO Right 02/22/2018   Procedure: CATARACT EXTRACTION PHACO AND INTRAOCULAR LENS PLACEMENT (IOC) RIGHT;  Surgeon: Brittany Adine Anes, Phillips;  Location: [facility];  Service: Ophthalmology;  Laterality: Right;   CATARACT EXTRACTION W/PHACO Left 03/29/2018   Procedure: CATARACT EXTRACTION PHACO AND INTRAOCULAR LENS PLACEMENT (IOC) LEFT IVA TOPICAL;  Surgeon: Brittany Adine Anes, Phillips;  Location: [facility];  Service: Ophthalmology;  Laterality: Left;   CHOLECYSTECTOMY     COLONOSCOPY WITH PROPOFOL  N/A 06/27/2015   Procedure: COLONOSCOPY WITH PROPOFOL ;  Surgeon: Brittany ONEIDA Holmes, Phillips;  Location: [facility];  Service: Endoscopy;  Laterality: N/A;   COLONOSCOPY WITH PROPOFOL  N/A 01/19/2024   Procedure: COLONOSCOPY  WITH PROPOFOL ;  Surgeon: Brittany Phillips;  Location: [facility] ENDOSCOPY;  Service: Gastroenterology;  Laterality: N/A;   COLONOSCOPY, ESOPHAGOGASTRODUODENOSCOPY (EGD) AND ESOPHAGEAL DILATION     ESOPHAGOGASTRODUODENOSCOPY N/A 12/09/2022   Procedure: ESOPHAGOGASTRODUODENOSCOPY (EGD);  Surgeon: Brittany Phillips;  Location: [facility] ENDOSCOPY;  Service: Gastroenterology;  Laterality: N/A;   ESOPHAGOGASTRODUODENOSCOPY (EGD) WITH PROPOFOL  N/A 06/27/2015   Procedure: ESOPHAGOGASTRODUODENOSCOPY (EGD) WITH PROPOFOL ;  Surgeon: Brittany ONEIDA Holmes, Phillips;  Location: [facility] ENDOSCOPY;  Service: Endoscopy;  Laterality: N/A;   GANGLION CYST EXCISION Right 08/06/2022   Procedure: REMOVAL GANGLION CYST FOOT;  Surgeon: Brittany Phillips, DPM;  Location: [facility];  Service: Podiatry;  Laterality: Right;  Anesthesia: MAC w/ Local   INCISION AND DRAINAGE OF WOUND Left 09/21/2022   Procedure: IRRIGATION AND DEBRIDEMENT HEMATOMA;  Surgeon: Brittany Dorothyann LABOR, DO;  Location: [facility];  Service: General;  Laterality: Left;   POLYPECTOMY  01/19/2024   Procedure: POLYPECTOMY;  Surgeon: Brittany Phillips;  Location: [facility] ENDOSCOPY;  Service: Gastroenterology;;   HARLEY DILATION  06/27/2015   Procedure: HARLEY DILATION;  Surgeon: Brittany ONEIDA Holmes, Phillips;  Location: [facility] ENDOSCOPY;  Service: Endoscopy;;   TONSILLECTOMY     Patient Active Problem List   Diagnosis Date Noted   Stage 3b chronic kidney disease (HCC) 01/27/2022  Cervical disc disorder with radiculopathy 09/03/2021   Pain in right hand 05/13/2020   Acquired hypothyroidism 06/25/2017   B12 deficiency 06/25/2017   Benign essential hypertension 12/14/2016   Low serum vitamin D 12/14/2016   Medicare annual wellness visit, initial 12/14/2016   Hyperlipidemia, mixed 02/03/2016   Hemispheric carotid artery syndrome 02/03/2016   PVC (premature ventricular contraction) 06/01/2014   Lumbar disc disease 06/01/2014   GERD (gastroesophageal reflux disease)  06/01/2014    ONSET DATE: 06/15/2024  REFERRING DIAG: nontraumatic subluxation of extensor tendon at metacarpophalangeal joint of the left hand.  THERAPY DIAG:  Pain in joint of left hand  Muscle weakness (generalized)  Stiffness of left hand, not elsewhere classified  Rationale for Evaluation and Treatment: Rehabilitation  SUBJECTIVE:   SUBJECTIVE STATEMENT: My left hand was the one that was were hurting at the fingers.  But now it is my right hand.  Like this morning a burning pain between my knuckles of my pointer finger and middle finger.  Pt has a history of CT surgery on her right  in 2021.   Pt accompanied by: self  PERTINENT HISTORY: Per last Phillips note: Brittany Phillips is an 83 year old female who presents with hand deformities and pain, suspected to be related to rheumatoid arthritis. She has experienced finger deformities for a couple of years, with recent onset of pain in her hands. She describes a tearing sensation in her middle finger when lifting or picking up objects, causing significant discomfort throughout the day. Both her ring and middle fingers sublux, and she experiences tenderness in her hands. She reports that her fingers may pop, but do not lock in place.She previously underwent surgery on one hand for a similar issue in [place], though details of the procedure are unavailable. The condition has worsened significantly in the last month, affecting her ability to perform activities such as cutting and chopping vegetables. Despite this, she has managed to complete tasks like preparing beans and corn for storage.  She reports consistent swelling in her middle finger, describing it as fluid-filled, similar to a ganglion. No issues with her wrist or elbow are noted. 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 This condition has persisted for several years, with a recent exacerbation. Clinical presentation and x-ray findings, including erosive changes and ulnar deviation, suggest rheumatoid arthritis. Blood work will confirm the diagnosis. Kidney dysfunction complicates treatment, limiting anti-inflammatory medication use. Order blood work to test for rheumatoid arthritis. Refer to hand therapy for splinting and occupational therapy. Schedule a hand specialist consultation next month. Consider a rheumatology referral based on lab results.  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  PRECAUTIONS: None  RED FLAGS: Pt recently with dehydration, arthritic changes in hands    WEIGHT BEARING RESTRICTIONS: No  PAIN:  Are you having pain? Yes: NPRS scale: 5 Pain location: right hand, left hand Pain description: aching, some sharp pains at times Aggravating factors: with use  Relieving factors: rest, voltaran gel   FALLS: Has patient fallen in last 6 months? Yes. Number of falls 2, the other day, dizziness with dehydration and also 1 fall in the tub  LIVING ENVIRONMENT: Lives with: lives with their spouse Lives in: Mobile home Stairs: No Has following equipment at home:  Single point cane, Walker - 2 wheeled, shower chair, and Grab bars  PLOF: Independent  PATIENT GOALS: Pt would like to be able to use bilateral hands for daily tasks, would like to stop dropping items and be able to can and put up her vegetables that come in season.    NEXT Phillips VISIT: unsure of date  OBJECTIVE:  Note: Objective measures were completed at Evaluation unless otherwise noted.  HAND DOMINANCE: Right  ADLs: Overall ADLs: Independent with self care tasks, husband helps with sweeping, dishes and heavier household chores. Pt reports she loves to cook which is a big part of her day.  She has a garden in which she grows  a variety of vegetables.  She recently worked to put up [employer] and had difficulty with cutting/chopping and freezing it.  She also needs to be able to put up green beans which involves snapping which she can no longer perform.  She cut the last batch.  She reports switching from glass bowls which are hard to manage to plastic ones.  She is able to drive but reports increased pain 5/10 with grippng the steering wheel.  She has difficulty with donning shoes and tying so she wears slip on shoes.  She typically performs a sponge bath lately.  Pt also wears thin silicone non slip gloves to be able to hold items.     FUNCTIONAL OUTCOME MEASURES: TBA  UPPER EXTREMITY ROM:     Active ROM Right eval Left eval  Shoulder flexion 128 125  Shoulder abduction 150 150  Shoulder adduction    Shoulder extension    Shoulder internal rotation    Shoulder external rotation    Elbow flexion 130 130  Elbow extension 0 0  Wrist flexion 75 80  Wrist extension 70 55  Wrist ulnar deviation 40 40  Wrist radial deviation 6 4  Wrist pronation Full  full  Wrist supination Full  Full   (Blank rows = not tested)  Active ROM Right eval Left eval  Thumb MCP (0-60) 55 40  Thumb IP (0-80) 80 75  Thumb Radial abd/add (0-55)     Thumb Palmar abd/add (0-45)     Thumb Opposition to Small Finger intact  intact  Index MCP (0-90)     Index PIP (0-100)     Index DIP (0-70)      Long MCP (0-90)      Long PIP (0-100)      Long DIP (0-70)      Ring MCP (0-90)      Ring PIP (0-100)      Ring DIP (0-70)      Little MCP (0-90)      Little PIP (0-100)      Little DIP (0-70)      (Blank rows = not tested)  Full fisting bilaterally, ulnar drift   UPPER EXTREMITY MMT:     MMT Right eval Left eval  Shoulder flexion    Shoulder abduction    Shoulder adduction    Shoulder extension    Shoulder internal rotation    Shoulder external rotation    Middle trapezius    Lower trapezius    Elbow flexion    Elbow  extension    Wrist flexion    Wrist extension    Wrist ulnar deviation    Wrist radial deviation    Wrist pronation    Wrist supination    (Blank rows = not tested)  HAND FUNCTION: Grip strength:  Right: 22 lbs; Left: 27 lbs, Lateral pinch: Right: 6 lbs, Left: 6 lbs, and 3 point pinch: Right: 4 lbs, Left: 4 lbs  COORDINATION: Impaired speed and coordination of movements.  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  She reports popping at times in the left MF but denies any locking.  Pt with a cyst on the MP of Left MF. No tenderness noted at A1 pulley today.  No pain reported at rest but has 5/10 pain with driving today.    TREATMENT DATE: 08/04/24                                                                                                                           Patient returns to occupational therapy after not being seen for about a month.  Patient canceled or no-show a couple of times. Patient reports left hand feeling well but the right hand between the 2nd and 3rd metacarpals hurts at times and burning pain this morning. Upon assessment patient has ulnar deviation drift from rheumatoid arthritis in bilateral hands. Patient appeared to have subluxed extensor tendon at the [employer] left third and right second digits Patient can make composite fist but with extension unable to fully extend without a click or a pop at those 2 digits.   Reviewed with patient joint protection principles with using larger body parts like palms and forearms to carry and lift objects As well as enlarging her grips on her utensils on her cooking utensils on her super duster, her broom or rakes. Patient do like cooking. Provided patient with a handout on modifications as well as joint protection and adaptations for cooking. Recommend for patient to get insulation  tubing at [facility] or [employer] to build up her handles. As well as spring-loaded scissors to open her packages [employer] in the kitchen.  Assess patient for orthotic fitting. Patient was fitted with bilateral antiulnar deviation arthritis splints on the right and the left -Left was fitted with a medium in the right was fitted with a small. Patient's fingers alignment great in splints.  Was modified to provide patient with good joint alignment at the metacarpals.  Recommend for patient to sleep with them as long as they pain-free.  To avoid composite flexion when sleeping.  And aligning the metacarpals. She was educated in donning and doffing correctly as well as wearing.  Also fabricated patient to custom MC block splints for left third and right second to wear during the day to avoid composite flexion and subluxing of extensor tendon on the metacarpal. Patient was educated in donning and doffing as well as wearing them correctly. Patient demonstrated and verbalized understanding. Patient to wear for 10 to 14 days splints as well as focusing on modifications and joint protection and adaptive equipment.    Recommended steering wheel cover for increased surface grip to  decrease pain. Discussed additional recommendations for joint protection with kitchen tasks.   PATIENT EDUCATION: Education details: use of contrast for edema management,  Person educated: Patient Education method: Medical illustrator Education comprehension: verbalized understanding  HOME EXERCISE PROGRAM: Will provide a more comprehensive program next session.    GOALS: Goals reviewed with patient? Yes  SHORT TERM GOALS: Target date: 07/21/2024   Pt will demonstrate understanding of use of contrast for edema control in bilateral UEs.   Baseline: no current knowledge Goal status: INITIAL  LONG TERM GOALS: Target date: 08/11/2024  Pt to demonstrate understanding and implementation of joint protection  principles into daily routine Baseline: limited knowledge at eval. Goal status: INITIAL  2.  Pt will demonstrate improved ROM of left thumb by 10 degrees to hold objects with secure grip with minimal dropping of items. Baseline: decreased ROM at left thumb in IP and MP and drops items frequently. Goal status: INITIAL  3.  Pt will demonstrate decreased pain 2/10 with modifications to driving. Baseline: increased pain with driving 4/89 with gripping steering wheel.   Goal status: INITIAL  4.  Pt will demonstrate donning and doffing of splints with modified independence.   Baseline: no splints currently Goal status: INITIAL  5.  Pt will demonstrate knowledge and implement strategies to assist with canning and freezing of garden vegetables following joint protection principles.  Baseline: limited knowledge of joint protection principles. Goal status: INITIAL   ASSESSMENT:  CLINICAL IMPRESSION: Patient is a 83 y.o. 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 Pt has a history of similar issues with the right hand in the past with surgical intervention.  Pt demonstrates hand weakness, decreased strength, decreased range of motion, pain and decreased ability to perform necessary daily tasks.  Pt reports difficulty with holding items, cutting, handwriting, folding laundry, chopping and freezing in season vegetables from her garden and pain with driving.  NOW patient was not seen for about a month.  Canceled or no-showed for a couple of sessions.  This date patient arrived with reports of left hand feeling better but right 2nd and 3rd metacarpal hurting.  Increased difficulty with cooking that she loves.  Patient was fitted with over-the-counter bilateral antiulnar deviation arthritis splints.  To wear at nighttime.  To avoid composite flexion and align metacarpals correctly and alignment of  tendons.  Also fabricated to custom MC block splints for daytime use for left third and right second digit to avoid composite flexion with gripping activities and decreasing her pain.  Patient will follow-up with me in 10 to 14 days after wearing splints as well as doing modifications and joint protection and adaptive equipment.  She would benefit from skilled Occupational Therapy to maximize safety and independence in necessary daily tasks.    PERFORMANCE DEFICITS: in functional skills including ADLs, IADLs, coordination, dexterity, edema, ROM, strength, pain, flexibility, and UE functional use, and psychosocial skills including environmental adaptation, habits, and routines and behaviors.   IMPAIRMENTS: are limiting patient from ADLs, IADLs, and leisure.   COMORBIDITIES: may have co-morbidities  that affects occupational performance. Patient will benefit from skilled OT to address above impairments and improve overall function.  MODIFICATION OR ASSISTANCE TO COMPLETE EVALUATION: Min-Moderate modification of tasks or assist with assess necessary to complete an evaluation.  OT OCCUPATIONAL PROFILE AND HISTORY: Detailed assessment: Review of records and additional review of physical, cognitive, psychosocial history related to current functional performance.  CLINICAL DECISION MAKING: Moderate - several treatment options, min-mod task modification necessary  REHAB POTENTIAL: Good  EVALUATION COMPLEXITY: Moderate      PLAN:  OT FREQUENCY: 1-2x/week  OT DURATION: 6 weeks  PLANNED INTERVENTIONS: 97168 OT Re-evaluation, 97535 self care/ADL training, 02889 therapeutic exercise, 97530 therapeutic activity, 97112 neuromuscular re-education, 97140 manual therapy, 97035 ultrasound, 97018 paraffin, 02960 fluidotherapy, 97010 moist heat, 97010 cryotherapy, 97034 contrast bath, 97033 iontophoresis, 97760 Orthotic Initial, 97763 Orthotic/Prosthetic subsequent, patient/family education, and DME and/or AE  instructions  RECOMMENDED OTHER SERVICES: none  CONSULTED AND AGREED WITH PLAN OF CARE: Patient  PLAN FOR NEXT SESSION: assess for splints next session  Amy T Lovett, OTR/L, CLT Aashika Carta, [place], OT 08/04/2024, 12:05 PM   OUTPATIENT OCCUPATIONAL THERAPY ORTHO TREATMENT  Patient Name: Brittany Phillips MRN: [id number] DOB:04-17-1941, 83 y.o., female   PCP: Brittany Anes, Phillips REFERRING PROVIDER: Kathlynn Sharper, Phillips  END OF SESSION:  OT End of Session - 08/04/24 0820     Visit Number 2    Number of Visits 13    Date for Recertification  08/11/24    OT Start Time 0820    OT Stop Time 0908    OT Time Calculation (min) 48 min    Activity Tolerance Patient tolerated treatment well    Behavior During Therapy WFL for tasks assessed/performed          Past Medical History:  Diagnosis Date   Anemia    Anxiety    Cervicalgia    Depression    Dysrhythmia    GERD (gastroesophageal reflux disease)    Headache    History of hiatal hernia    Hyperlipidemia    Hyperlipidemia    Hypertension    Loss of hearing    aids   Lumbar disc disease    Migraines    Neuralgia    Post menopausal problems    PVC's (premature ventricular contractions)    Rosacea    Stroke (HCC)    TIA (transient ischemic attack) 2017   Vitamin D deficiency    Wears hearing aid in both ears    Past Surgical History:  Procedure Laterality Date   ABDOMINAL HYSTERECTOMY     BUNIONECTOMY     CATARACT EXTRACTION W/PHACO Right 02/22/2018   Procedure: CATARACT EXTRACTION PHACO AND INTRAOCULAR LENS PLACEMENT (IOC) RIGHT;  Surgeon: Brittany Adine Anes, Phillips;  Location: [facility];  Service: Ophthalmology;  Laterality: Right;   CATARACT EXTRACTION W/PHACO Left 03/29/2018   Procedure: CATARACT EXTRACTION PHACO AND INTRAOCULAR LENS PLACEMENT (IOC) LEFT IVA TOPICAL;  Surgeon: Brittany Adine Anes, Phillips;  Location: [facility];  Service: Ophthalmology;  Laterality: Left;   CHOLECYSTECTOMY     COLONOSCOPY WITH  PROPOFOL  N/A 06/27/2015   Procedure: COLONOSCOPY WITH PROPOFOL ;  Surgeon: Brittany ONEIDA Holmes, Phillips;  Location: [facility] ENDOSCOPY;  Service: Endoscopy;  Laterality: N/A;   COLONOSCOPY WITH PROPOFOL  N/A 01/19/2024   Procedure: COLONOSCOPY WITH PROPOFOL ;  Surgeon: Brittany Phillips;  Location: [facility] ENDOSCOPY;  Service: Gastroenterology;  Laterality: N/A;   COLONOSCOPY, ESOPHAGOGASTRODUODENOSCOPY (EGD) AND ESOPHAGEAL DILATION     ESOPHAGOGASTRODUODENOSCOPY N/A 12/09/2022   Procedure: ESOPHAGOGASTRODUODENOSCOPY (EGD);  Surgeon: Brittany Phillips;  Location: [facility] ENDOSCOPY;  Service: Gastroenterology;  Laterality: N/A;   ESOPHAGOGASTRODUODENOSCOPY (EGD) WITH PROPOFOL  N/A 06/27/2015   Procedure: ESOPHAGOGASTRODUODENOSCOPY (EGD) WITH PROPOFOL ;  Surgeon: Brittany ONEIDA Holmes, Phillips;  Location: [facility] ENDOSCOPY;  Service: Endoscopy;  Laterality: N/A;   GANGLION CYST EXCISION Right 08/06/2022   Procedure: REMOVAL GANGLION CYST FOOT;  Surgeon: Brittany Phillips, DPM;  Location: [facility]ERY [facility];  Service: Podiatry;  Laterality: Right;  Anesthesia: MAC w/ Local   INCISION AND DRAINAGE OF WOUND Left 09/21/2022   Procedure: IRRIGATION AND DEBRIDEMENT HEMATOMA;  Surgeon: Brittany Dorothyann LABOR, DO;  Location: [facility];  Service: General;  Laterality: Left;   POLYPECTOMY  01/19/2024   Procedure: POLYPECTOMY;  Surgeon: Brittany Phillips;  Location: [facility] ENDOSCOPY;  Service: Gastroenterology;;   HARLEY DILATION  06/27/2015   Procedure: HARLEY DILATION;  Surgeon: Brittany ONEIDA Holmes, Phillips;  Location: [facility] ENDOSCOPY;  Service: Endoscopy;;   TONSILLECTOMY     Patient Active Problem List   Diagnosis Date Noted   Stage 3b chronic kidney disease (HCC) 01/27/2022   Cervical disc disorder with radiculopathy 09/03/2021   Pain in right hand 05/13/2020   Acquired hypothyroidism 06/25/2017   B12 deficiency 06/25/2017   Benign essential hypertension 12/14/2016   Low serum vitamin D 12/14/2016   Medicare annual wellness visit, initial  12/14/2016   Hyperlipidemia, mixed 02/03/2016   Hemispheric carotid artery syndrome 02/03/2016   PVC (premature ventricular contraction) 06/01/2014   Lumbar disc disease 06/01/2014   GERD (gastroesophageal reflux disease) 06/01/2014    ONSET DATE: 06/15/2024  REFERRING DIAG: nontraumatic subluxation of extensor tendon at metacarpophalangeal joint of the left hand.  THERAPY DIAG:  Pain in joint of left hand  Muscle weakness (generalized)  Stiffness of left hand, not elsewhere classified  Rationale for Evaluation and Treatment: Rehabilitation  SUBJECTIVE:   SUBJECTIVE STATEMENT: Pt reports she is feeling dehydrated.  She reports the left hand is doing better but right hand is worse than left.  She reports decreased pain and feeling better over the last 3-4 weeks.  Pt has a history of CT surgery on her right  in 2021.   Pt accompanied by: self  PERTINENT HISTORY: Per last Phillips note: VERTIE DIBBERN is an 82 year old female who presents with hand deformities and pain, suspected to be related to rheumatoid arthritis. She has experienced finger deformities for a couple of years, with recent onset of pain in her hands. She describes a tearing sensation in her middle finger when lifting or picking up objects, causing significant discomfort throughout the day. Both her ring and middle fingers sublux, and she experiences tenderness in her hands. She reports that her fingers may pop, but do not lock in place.She previously underwent surgery on one hand for a similar issue in [place], though details of the procedure are unavailable. The condition has worsened significantly in the last month, affecting her ability to perform activities such as cutting and chopping vegetables. Despite this, she has managed to complete tasks like preparing beans and corn for storage.  She reports consistent swelling in her middle finger, describing it as fluid-filled, similar to a ganglion. No issues with her wrist or  elbow are noted. 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 This condition has persisted for several years, with a recent exacerbation. Clinical presentation and x-ray findings, including erosive changes and ulnar deviation, suggest rheumatoid arthritis. Blood work will confirm the diagnosis. Kidney dysfunction complicates treatment, limiting anti-inflammatory medication use. Order blood work to test for rheumatoid arthritis. Refer to hand therapy for splinting and occupational therapy. Schedule a hand specialist consultation next month. Consider a rheumatology referral based on lab results.  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  PRECAUTIONS: None  RED FLAGS: Pt recently with dehydration, arthritic changes in hands    WEIGHT BEARING RESTRICTIONS: No  PAIN:  Are you having pain? Yes: NPRS scale: 5 Pain location: right hand, left hand Pain description: aching, some sharp pains at times Aggravating factors: with use  Relieving factors: rest, voltaran gel   FALLS: Has patient fallen in last 6 months? Yes. Number of falls 2, the other day, dizziness with dehydration and also 1 fall in the tub  LIVING ENVIRONMENT: Lives with: lives with their spouse Lives in: Mobile home Stairs: No Has following equipment at home: Single point cane, Environmental consultant - 2 wheeled, shower chair, and Grab bars  PLOF: Independent  PATIENT GOALS: Pt would like to be able to use bilateral hands for daily tasks, would like to stop dropping items and be able to can and put up her vegetables that come in season.    NEXT [facility] VISIT: unsure of date  OBJECTIVE:  Note: Objective measures were completed at Evaluation unless otherwise noted.  HAND DOMINANCE:  Right  ADLs: Overall ADLs: Independent with self care tasks, husband helps with sweeping, dishes and heavier household chores. Pt reports she loves to cook which is a big part of her day.  She has a garden in which she grows a variety of vegetables.  She recently worked to put up [employer] and had difficulty with cutting/chopping and freezing it.  She also needs to be able to put up green beans which involves snapping which she can no longer perform.  She cut the last batch.  She reports switching from glass bowls which are hard to manage to plastic ones.  She is able to drive but reports increased pain 5/10 with grippng the steering wheel.  She has difficulty with donning shoes and tying so she wears slip on shoes.  She typically performs a sponge bath lately.  Pt also wears thin silicone non slip gloves to be able to hold items.     FUNCTIONAL OUTCOME MEASURES: TBA  UPPER EXTREMITY ROM:     Active ROM Right eval Left eval  Shoulder flexion 128 125  Shoulder abduction 150 150  Shoulder adduction    Shoulder extension    Shoulder internal rotation    Shoulder external rotation    Elbow flexion 130 130  Elbow extension 0 0  Wrist flexion 75 80  Wrist extension 70 55  Wrist ulnar deviation 40 40  Wrist radial deviation 6 4  Wrist pronation Full  full  Wrist supination Full  Full   (Blank rows = not tested)  Active ROM Right eval Left eval  Thumb MCP (0-60) 55 40  Thumb IP (0-80) 80 75  Thumb Radial abd/add (0-55)     Thumb Palmar abd/add (0-45)     Thumb Opposition to Small Finger intact  intact  Index MCP (0-90)     Index PIP (0-100)     Index DIP (0-70)      Long MCP (0-90)      Long PIP (0-100)      Long DIP (0-70)      Ring MCP (0-90)      Ring PIP (0-100)      Ring DIP (0-70)      Little MCP (0-90)      Little PIP (0-100)      Little DIP (0-70)      (  Blank rows = not tested)  Full fisting bilaterally, ulnar drift   UPPER EXTREMITY MMT:     MMT Right eval  Left eval  Shoulder flexion    Shoulder abduction    Shoulder adduction    Shoulder extension    Shoulder internal rotation    Shoulder external rotation    Middle trapezius    Lower trapezius    Elbow flexion    Elbow extension    Wrist flexion    Wrist extension    Wrist ulnar deviation    Wrist radial deviation    Wrist pronation    Wrist supination    (Blank rows = not tested)  HAND FUNCTION: Grip strength: Right: 22 lbs; Left: 27 lbs, Lateral pinch: Right: 6 lbs, Left: 6 lbs, and 3 point pinch: Right: 4 lbs, Left: 4 lbs  COORDINATION: Impaired speed and coordination of movements.  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  She reports popping at times in the left MF but denies any locking.  Pt with a cyst on the MP of Left MF. No tenderness noted at A1 pulley today.  No pain reported at rest but has 5/10 pain with driving today.    TREATMENT DATE: 08/04/24                                                                                                                           Modalities: Contrast bath:  Time: 8 mins  Location: bilateral hands/wrists To decrease pain, increase tissue mobility and increase ROM.   ROM following contrast  Recommended steering wheel cover for increased surface grip to decrease pain. Discussed additional recommendations for joint protection with kitchen tasks.   PATIENT EDUCATION: Education details: use of contrast for edema management,  Person educated: Patient Education method: Medical illustrator Education comprehension: verbalized understanding  HOME EXERCISE PROGRAM: Will provide a more comprehensive program next session.    GOALS: Goals reviewed with patient? Yes  SHORT TERM GOALS: Target date: 07/21/2024   Pt will demonstrate understanding of use of contrast for edema  control in bilateral UEs.   Baseline: no current knowledge Goal status: INITIAL  LONG TERM GOALS: Target date: 08/11/2024  Pt to demonstrate understanding and implementation of joint protection principles into daily routine Baseline: limited knowledge at eval. Goal status: INITIAL  2.  Pt will demonstrate improved ROM of left thumb by 10 degrees to hold objects with secure grip with minimal dropping of items. Baseline: decreased ROM at left thumb in IP and MP and drops items frequently. Goal status: INITIAL  3.  Pt will demonstrate decreased pain 2/10 with modifications to driving. Baseline: increased pain with driving 4/89 with gripping steering wheel.   Goal status: INITIAL  4.  Pt will demonstrate donning and doffing of splints with modified independence.   Baseline: no splints currently  Goal status: INITIAL  5.  Pt will demonstrate knowledge and implement strategies to assist with canning and freezing of garden vegetables following joint protection principles.  Baseline: limited knowledge of joint protection principles. Goal status: INITIAL   ASSESSMENT:  CLINICAL IMPRESSION: Patient is a 83 y.o. 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 Pt has a history of similar issues with the right hand in the past with surgical intervention.  Pt demonstrates hand weakness, decreased strength, decreased range of motion, pain and decreased ability to perform necessary daily tasks.  Pt reports difficulty with holding items, cutting, handwriting, folding laundry, chopping and freezing in season vegetables from her garden and pain with driving.  She would benefit from skilled Occupational Therapy to maximize safety and independence in necessary daily tasks.    PERFORMANCE DEFICITS: in functional skills including ADLs, IADLs, coordination, dexterity, edema, ROM, strength, pain,  flexibility, and UE functional use, and psychosocial skills including environmental adaptation, habits, and routines and behaviors.   IMPAIRMENTS: are limiting patient from ADLs, IADLs, and leisure.   COMORBIDITIES: may have co-morbidities  that affects occupational performance. Patient will benefit from skilled OT to address above impairments and improve overall function.  MODIFICATION OR ASSISTANCE TO COMPLETE EVALUATION: Min-Moderate modification of tasks or assist with assess necessary to complete an evaluation.  OT OCCUPATIONAL PROFILE AND HISTORY: Detailed assessment: Review of records and additional review of physical, cognitive, psychosocial history related to current functional performance.  CLINICAL DECISION MAKING: Moderate - several treatment options, min-mod task modification necessary  REHAB POTENTIAL: Good  EVALUATION COMPLEXITY: Moderate      PLAN:  OT FREQUENCY: 1-2x/week  OT DURATION: 6 weeks  PLANNED INTERVENTIONS: 97168 OT Re-evaluation, 97535 self care/ADL training, 02889 therapeutic exercise, 97530 therapeutic activity, 97112 neuromuscular re-education, 97140 manual therapy, 97035 ultrasound, 97018 paraffin, 02960 fluidotherapy, 97010 moist heat, 97010 cryotherapy, 97034 contrast bath, 97033 iontophoresis, 97760 Orthotic Initial, 97763 Orthotic/Prosthetic subsequent, patient/family education, and DME and/or AE instructions  RECOMMENDED OTHER SERVICES: none  CONSULTED AND AGREED WITH PLAN OF CARE: Patient  PLAN FOR NEXT SESSION: assess for splints next session  Ancel Peters, OT R/L,CLT 08/04/2024, 12:05 PM

## 2024-08-15 ENCOUNTER — Encounter: Admitting: Occupational Therapy

## 2024-08-21 ENCOUNTER — Ambulatory Visit: Attending: Orthopedic Surgery | Admitting: Occupational Therapy

## 2024-08-21 DIAGNOSIS — M6281 Muscle weakness (generalized): Secondary | ICD-10-CM | POA: Diagnosis present

## 2024-08-21 DIAGNOSIS — M25642 Stiffness of left hand, not elsewhere classified: Secondary | ICD-10-CM | POA: Diagnosis present

## 2024-08-21 DIAGNOSIS — M25542 Pain in joints of left hand: Secondary | ICD-10-CM | POA: Diagnosis present

## 2024-08-21 NOTE — Therapy (Signed)
 OUTPATIENT OCCUPATIONAL THERAPY ORTHO TREATMENT  Patient Name: Brittany Phillips MRN: [id number] DOB:[birth year], 83 y.o., female   PCP: Cleotilde Anes, MD REFERRING PROVIDER: Kathlynn Sharper, MD  END OF SESSION:  OT End of Session - 08/21/24 1433     Visit Number 3    Number of Visits 3    Date for Recertification  08/21/24    OT Start Time 1433    OT Stop Time 1511    OT Time Calculation (min) 38 min    Activity Tolerance Patient tolerated treatment well          Past Medical History:  Diagnosis Date   Anemia    Anxiety    Cervicalgia    Depression    Dysrhythmia    GERD (gastroesophageal reflux disease)    Headache    History of hiatal hernia    Hyperlipidemia    Hyperlipidemia    Hypertension    Loss of hearing    aids   Lumbar disc disease    Migraines    Neuralgia    Post menopausal problems    PVC's (premature ventricular contractions)    Rosacea    Stroke (HCC)    TIA (transient ischemic attack) 2017   Vitamin D deficiency    Wears hearing aid in both ears    Past Surgical History:  Procedure Laterality Date   ABDOMINAL HYSTERECTOMY     BUNIONECTOMY     CATARACT EXTRACTION W/PHACO Right 02/22/2018   Procedure: CATARACT EXTRACTION PHACO AND INTRAOCULAR LENS PLACEMENT (IOC) RIGHT;  Surgeon: Myrna Adine Anes, MD;  Location: [facility];  Service: Ophthalmology;  Laterality: Right;   CATARACT EXTRACTION W/PHACO Left 03/29/2018   Procedure: CATARACT EXTRACTION PHACO AND INTRAOCULAR LENS PLACEMENT (IOC) LEFT IVA TOPICAL;  Surgeon: Myrna Adine Anes, MD;  Location: [facility];  Service: Ophthalmology;  Laterality: Left;   CHOLECYSTECTOMY     COLONOSCOPY WITH PROPOFOL  N/A 06/27/2015   Procedure: COLONOSCOPY WITH PROPOFOL ;  Surgeon: Lamar ONEIDA Holmes, MD;  Location: [facility] ENDOSCOPY;  Service: Endoscopy;  Laterality: N/A;   COLONOSCOPY WITH PROPOFOL  N/A 01/19/2024   Procedure: COLONOSCOPY WITH PROPOFOL ;  Surgeon: Toledo, Ladell POUR, MD;  Location: [facility]  ENDOSCOPY;  Service: Gastroenterology;  Laterality: N/A;   COLONOSCOPY, ESOPHAGOGASTRODUODENOSCOPY (EGD) AND ESOPHAGEAL DILATION     ESOPHAGOGASTRODUODENOSCOPY N/A 12/09/2022   Procedure: ESOPHAGOGASTRODUODENOSCOPY (EGD);  Surgeon: Toledo, Ladell POUR, MD;  Location: [facility] ENDOSCOPY;  Service: Gastroenterology;  Laterality: N/A;   ESOPHAGOGASTRODUODENOSCOPY (EGD) WITH PROPOFOL  N/A 06/27/2015   Procedure: ESOPHAGOGASTRODUODENOSCOPY (EGD) WITH PROPOFOL ;  Surgeon: Lamar ONEIDA Holmes, MD;  Location: [facility] ENDOSCOPY;  Service: Endoscopy;  Laterality: N/A;   GANGLION CYST EXCISION Right 08/06/2022   Procedure: REMOVAL GANGLION CYST FOOT;  Surgeon: Lennie Barter, DPM;  Location: [facility];  Service: Podiatry;  Laterality: Right;  Anesthesia: MAC w/ Local   INCISION AND DRAINAGE OF WOUND Left 09/21/2022   Procedure: IRRIGATION AND DEBRIDEMENT HEMATOMA;  Surgeon: Evonnie Dorothyann LABOR, DO;  Location: [facility];  Service: General;  Laterality: Left;   POLYPECTOMY  01/19/2024   Procedure: POLYPECTOMY;  Surgeon: Aundria, Ladell POUR, MD;  Location: [facility] ENDOSCOPY;  Service: Gastroenterology;;   HARLEY DILATION  06/27/2015   Procedure: HARLEY DILATION;  Surgeon: Lamar ONEIDA Holmes, MD;  Location: [facility] ENDOSCOPY;  Service: Endoscopy;;   TONSILLECTOMY     Patient Active Problem List   Diagnosis Date Noted   Stage 3b chronic kidney disease (HCC) 01/27/2022   Cervical disc disorder with radiculopathy 09/03/2021   Pain  in right hand 05/13/2020   Acquired hypothyroidism 06/25/2017   B12 deficiency 06/25/2017   Benign essential hypertension 12/14/2016   Low serum vitamin D 12/14/2016   Medicare annual wellness visit, initial 12/14/2016   Hyperlipidemia, mixed 02/03/2016   Hemispheric carotid artery syndrome 02/03/2016   PVC (premature ventricular contraction) 06/01/2014   Lumbar disc disease 06/01/2014   GERD (gastroesophageal reflux disease) 06/01/2014    ONSET DATE: 06/15/2024  REFERRING DIAG: nontraumatic  subluxation of extensor tendon at metacarpophalangeal joint of the left hand.  THERAPY DIAG:  Pain in joint of left hand  Muscle weakness (generalized)  Stiffness of left hand, not elsewhere classified  Rationale for Evaluation and Treatment: Rehabilitation  SUBJECTIVE:   SUBJECTIVE STATEMENT: My left hand was the one that was were hurting at the fingers.  But now it is my right hand.  Like this morning a burning pain between my knuckles of my pointer finger and middle finger.  Pt has a history of CT surgery on her right  in 2021.   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 She has experienced finger deformities for a couple of years, with recent onset of pain in her hands. She describes a tearing sensation in her middle finger when lifting or picking up objects, causing significant discomfort throughout the day. Both her ring and middle fingers sublux, and she experiences tenderness in her hands. She reports that her fingers may pop, but do not lock in place.She previously underwent surgery on one hand for a similar issue in [place], though details of the procedure are unavailable. The condition has worsened significantly in the last month, affecting her ability to perform activities such as cutting and chopping vegetables. Despite this, she has managed to complete tasks like preparing beans and corn for storage.  She reports consistent swelling in her middle finger, describing it as fluid-filled, similar to a ganglion. No issues with her wrist or elbow are noted. 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 This condition has persisted for several  years, with a recent exacerbation. Clinical presentation and x-ray findings, including erosive changes and ulnar deviation, suggest rheumatoid arthritis. Blood work will confirm the diagnosis. Kidney dysfunction complicates treatment, limiting anti-inflammatory medication use. Order blood work to test for rheumatoid arthritis. Refer to hand therapy for splinting and occupational therapy. Schedule a hand specialist consultation next month. Consider a rheumatology referral based on lab results.  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  PRECAUTIONS: None  RED FLAGS: Pt recently with dehydration, arthritic changes in hands    WEIGHT BEARING RESTRICTIONS: No  PAIN:  Are you having pain? Yes: NPRS scale: 5 Pain location: right hand, left hand Pain description: aching, some sharp pains at times Aggravating factors: with use  Relieving factors: rest, voltaran gel   FALLS: Has patient fallen in last 6 months? Yes. Number of falls 2, the other day, dizziness with dehydration and also 1 fall in the tub  LIVING ENVIRONMENT: Lives with: lives with their spouse Lives in: Mobile home Stairs: No Has following equipment at home: Single point cane, Environmental consultant - 2 wheeled, shower chair,  and Grab bars  PLOF: Independent  PATIENT GOALS: Pt would like to be able to use bilateral hands for daily tasks, would like to stop dropping items and be able to can and put up her vegetables that come in season.    NEXT MD VISIT: unsure of date  OBJECTIVE:  Note: Objective measures were completed at Evaluation unless otherwise noted.  HAND DOMINANCE: Right  ADLs: Overall ADLs: Independent with self care tasks, husband helps with sweeping, dishes and heavier household chores. Pt reports she loves to cook which is a big part of her day.  She has a garden in which she grows a variety of vegetables.  She recently worked to put up [employer] and had  difficulty with cutting/chopping and freezing it.  She also needs to be able to put up green beans which involves snapping which she can no longer perform.  She cut the last batch.  She reports switching from glass bowls which are hard to manage to plastic ones.  She is able to drive but reports increased pain 5/10 with grippng the steering wheel.  She has difficulty with donning shoes and tying so she wears slip on shoes.  She typically performs a sponge bath lately.  Pt also wears thin silicone non slip gloves to be able to hold items.     FUNCTIONAL OUTCOME MEASURES: TBA  UPPER EXTREMITY ROM:     Active ROM Right eval Left eval  Shoulder flexion 128 125  Shoulder abduction 150 150  Shoulder adduction    Shoulder extension    Shoulder internal rotation    Shoulder external rotation    Elbow flexion 130 130  Elbow extension 0 0  Wrist flexion 75 80  Wrist extension 70 55  Wrist ulnar deviation 40 40  Wrist radial deviation 6 4  Wrist pronation Full  full  Wrist supination Full  Full   (Blank rows = not tested)  Active ROM Right eval Left eval  Thumb MCP (0-60) 55 40  Thumb IP (0-80) 80 75  Thumb Radial abd/add (0-55)     Thumb Palmar abd/add (0-45)     Thumb Opposition to Small Finger intact  intact  Index MCP (0-90)     Index PIP (0-100)     Index DIP (0-70)      Long MCP (0-90)      Long PIP (0-100)      Long DIP (0-70)      Ring MCP (0-90)      Ring PIP (0-100)      Ring DIP (0-70)      Little MCP (0-90)      Little PIP (0-100)      Little DIP (0-70)      (Blank rows = not tested)  Full fisting bilaterally, ulnar drift   UPPER EXTREMITY MMT:     MMT Right eval Left eval  Shoulder flexion    Shoulder abduction    Shoulder adduction    Shoulder extension    Shoulder internal rotation    Shoulder external rotation    Middle trapezius    Lower trapezius    Elbow flexion    Elbow extension    Wrist flexion    Wrist extension    Wrist ulnar deviation     Wrist radial deviation    Wrist pronation    Wrist supination    (Blank rows = not tested)  HAND FUNCTION: Grip strength: Right: 22 lbs; Left: 27 lbs, Lateral pinch: Right:  6 lbs, Left: 6 lbs, and 3 point pinch: Right: 4 lbs, Left: 4 lbs  COORDINATION: Impaired speed and coordination of movements.  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  She reports popping at times in the left MF but denies any locking.  Pt with a cyst on the MP of Left MF. No tenderness noted at A1 pulley today.  No pain reported at rest but has 5/10 pain with driving today.    TREATMENT DATE: 08/04/24                                                                                                                           Patient returns to occupational therapy after not being seen for about a month.  Patient canceled or no-show a couple of times. Patient reports left hand feeling well but the right hand between the 2nd and 3rd metacarpals hurts at times and burning pain this morning. Upon assessment patient has ulnar deviation drift from rheumatoid arthritis in bilateral hands. Patient appeared to have subluxed extensor tendon at the [employer] left third and right second digits Patient can make composite fist but with extension unable to fully extend without a click or a pop at those 2 digits.   Reviewed with patient joint protection principles with using larger body parts like palms and forearms to carry and lift objects As well as enlarging her grips on her utensils on her cooking utensils on her super duster, her broom or rakes. Patient do like cooking. Provided patient with a handout on modifications as well as joint protection and adaptations for cooking. Recommend for patient to get insulation tubing at [facility] or [employer] to build up her handles. As well as  spring-loaded scissors to open her packages [employer] in the kitchen.  Assess patient for orthotic fitting. Patient was fitted with bilateral antiulnar deviation arthritis splints on the right and the left -Left was fitted with a medium in the right was fitted with a small. Patient's fingers alignment great in splints.  Was modified to provide patient with good joint alignment at the metacarpals.  Recommend for patient to sleep with them as long as they pain-free.  To avoid composite flexion when sleeping.  And aligning the metacarpals. She was educated in donning and doffing correctly as well as wearing.  Also fabricated patient to custom MC block splints for left third and right second to wear during the day to avoid composite flexion and subluxing of extensor tendon on the metacarpal. Patient was educated in donning and doffing as well as wearing them correctly. Patient demonstrated and verbalized understanding. Patient to wear for 10 to 14 days splints as well as focusing on modifications and joint protection and adaptive equipment.    Recommended steering wheel cover for increased surface grip to decrease pain. Discussed additional recommendations for joint protection with  kitchen tasks.   PATIENT EDUCATION: Education details: use of contrast for edema management,  Person educated: Patient Education method: Medical illustrator Education comprehension: verbalized understanding  HOME EXERCISE PROGRAM: Will provide a more comprehensive program next session.    GOALS: Goals reviewed with patient? Yes  SHORT TERM GOALS: Target date: 07/21/2024   Pt will demonstrate understanding of use of contrast for edema control in bilateral UEs.   Baseline: no current knowledge Goal status: INITIAL  LONG TERM GOALS: Target date: 08/11/2024  Pt to demonstrate understanding and implementation of joint protection principles into daily routine Baseline: limited knowledge at eval. Goal  status: INITIAL  2.  Pt will demonstrate improved ROM of left thumb by 10 degrees to hold objects with secure grip with minimal dropping of items. Baseline: decreased ROM at left thumb in IP and MP and drops items frequently. Goal status: INITIAL  3.  Pt will demonstrate decreased pain 2/10 with modifications to driving. Baseline: increased pain with driving 4/89 with gripping steering wheel.   Goal status: INITIAL  4.  Pt will demonstrate donning and doffing of splints with modified independence.   Baseline: no splints currently Goal status: INITIAL  5.  Pt will demonstrate knowledge and implement strategies to assist with canning and freezing of garden vegetables following joint protection principles.  Baseline: limited knowledge of joint protection principles. Goal status: INITIAL   ASSESSMENT:  CLINICAL IMPRESSION: Patient is a 83 y.o. 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 Pt has a history of similar issues with the right hand in the past with surgical intervention.  Pt demonstrates hand weakness, decreased strength, decreased range of motion, pain and decreased ability to perform necessary daily tasks.  Pt reports difficulty with holding items, cutting, handwriting, folding laundry, chopping and freezing in season vegetables from her garden and pain with driving.  NOW patient was not seen for about a month.  Canceled or no-showed for a couple of sessions.  This date patient arrived with reports of left hand feeling better but right 2nd and 3rd metacarpal hurting.  Increased difficulty with cooking that she loves.  Patient was fitted with over-the-counter bilateral antiulnar deviation arthritis splints.  To wear at nighttime.  To avoid composite flexion and align metacarpals correctly and alignment of tendons.  Also fabricated to custom MC block splints for daytime use for  left third and right second digit to avoid composite flexion with gripping activities and decreasing her pain.  Patient will follow-up with me in 10 to 14 days after wearing splints as well as doing modifications and joint protection and adaptive equipment.  She would benefit from skilled Occupational Therapy to maximize safety and independence in necessary daily tasks.    PERFORMANCE DEFICITS: in functional skills including ADLs, IADLs, coordination, dexterity, edema, ROM, strength, pain, flexibility, and UE functional use, and psychosocial skills including environmental adaptation, habits, and routines and behaviors.   IMPAIRMENTS: are limiting patient from ADLs, IADLs, and leisure.   COMORBIDITIES: may have co-morbidities  that affects occupational performance. Patient will benefit from skilled OT to address above impairments and improve overall function.  MODIFICATION OR ASSISTANCE TO COMPLETE EVALUATION: Min-Moderate modification of tasks or assist with assess necessary to complete an evaluation.  OT OCCUPATIONAL PROFILE AND HISTORY: Detailed assessment: Review of records and additional review of physical, cognitive, psychosocial history related to current functional performance.  CLINICAL DECISION MAKING: Moderate - several treatment options,  min-mod task modification necessary  REHAB POTENTIAL: Good  EVALUATION COMPLEXITY: Moderate      PLAN:  OT FREQUENCY: 1-2x/week  OT DURATION: 6 weeks  PLANNED INTERVENTIONS: 97168 OT Re-evaluation, 97535 self care/ADL training, 02889 therapeutic exercise, 97530 therapeutic activity, 97112 neuromuscular re-education, 97140 manual therapy, 97035 ultrasound, 97018 paraffin, 02960 fluidotherapy, 97010 moist heat, 97010 cryotherapy, 97034 contrast bath, 97033 iontophoresis, 97760 Orthotic Initial, 97763 Orthotic/Prosthetic subsequent, patient/family education, and DME and/or AE instructions  RECOMMENDED OTHER SERVICES: none  CONSULTED AND AGREED  WITH PLAN OF CARE: Patient  PLAN FOR NEXT SESSION: assess for splints next session  Amy T Lovett, OTR/L, CLT Delania Ferg, [place], OT 08/21/2024, 3:16 PM   OUTPATIENT OCCUPATIONAL THERAPY ORTHO TREATMENT/RECERT  Patient Name: Brittany Phillips MRN: [id number] DOB:December 17, 1940, 83 y.o., female   PCP: Cleotilde Anes, MD REFERRING PROVIDER: Kathlynn Sharper, MD  END OF SESSION:  OT End of Session - 08/21/24 1433     Visit Number 3    Number of Visits 3    Date for Recertification  08/21/24    OT Start Time 1433    OT Stop Time 1511    OT Time Calculation (min) 38 min    Activity Tolerance Patient tolerated treatment well          Past Medical History:  Diagnosis Date   Anemia    Anxiety    Cervicalgia    Depression    Dysrhythmia    GERD (gastroesophageal reflux disease)    Headache    History of hiatal hernia    Hyperlipidemia    Hyperlipidemia    Hypertension    Loss of hearing    aids   Lumbar disc disease    Migraines    Neuralgia    Post menopausal problems    PVC's (premature ventricular contractions)    Rosacea    Stroke (HCC)    TIA (transient ischemic attack) 2017   Vitamin D deficiency    Wears hearing aid in both ears    Past Surgical History:  Procedure Laterality Date   ABDOMINAL HYSTERECTOMY     BUNIONECTOMY     CATARACT EXTRACTION W/PHACO Right 02/22/2018   Procedure: CATARACT EXTRACTION PHACO AND INTRAOCULAR LENS PLACEMENT (IOC) RIGHT;  Surgeon: Myrna Adine Anes, MD;  Location: [facility];  Service: Ophthalmology;  Laterality: Right;   CATARACT EXTRACTION W/PHACO Left 03/29/2018   Procedure: CATARACT EXTRACTION PHACO AND INTRAOCULAR LENS PLACEMENT (IOC) LEFT IVA TOPICAL;  Surgeon: Myrna Adine Anes, MD;  Location: [facility];  Service: Ophthalmology;  Laterality: Left;   CHOLECYSTECTOMY     COLONOSCOPY WITH PROPOFOL  N/A 06/27/2015   Procedure: COLONOSCOPY WITH PROPOFOL ;  Surgeon: Lamar ONEIDA Holmes, MD;  Location: [facility] ENDOSCOPY;   Service: Endoscopy;  Laterality: N/A;   COLONOSCOPY WITH PROPOFOL  N/A 01/19/2024   Procedure: COLONOSCOPY WITH PROPOFOL ;  Surgeon: Toledo, Ladell POUR, MD;  Location: [facility] ENDOSCOPY;  Service: Gastroenterology;  Laterality: N/A;   COLONOSCOPY, ESOPHAGOGASTRODUODENOSCOPY (EGD) AND ESOPHAGEAL DILATION     ESOPHAGOGASTRODUODENOSCOPY N/A 12/09/2022   Procedure: ESOPHAGOGASTRODUODENOSCOPY (EGD);  Surgeon: Toledo, Ladell POUR, MD;  Location: [facility] ENDOSCOPY;  Service: Gastroenterology;  Laterality: N/A;   ESOPHAGOGASTRODUODENOSCOPY (EGD) WITH PROPOFOL  N/A 06/27/2015   Procedure: ESOPHAGOGASTRODUODENOSCOPY (EGD) WITH PROPOFOL ;  Surgeon: Lamar ONEIDA Holmes, MD;  Location: [facility] ENDOSCOPY;  Service: Endoscopy;  Laterality: N/A;   GANGLION CYST EXCISION Right 08/06/2022   Procedure: REMOVAL GANGLION CYST FOOT;  Surgeon: Lennie Barter, DPM;  Location: [facility];  Service: Podiatry;  Laterality: Right;  Anesthesia:  MAC w/ Local   INCISION AND DRAINAGE OF WOUND Left 09/21/2022   Procedure: IRRIGATION AND DEBRIDEMENT HEMATOMA;  Surgeon: Evonnie Dorothyann LABOR, DO;  Location: [facility];  Service: General;  Laterality: Left;   POLYPECTOMY  01/19/2024   Procedure: POLYPECTOMY;  Surgeon: Aundria, Ladell POUR, MD;  Location: [facility] ENDOSCOPY;  Service: Gastroenterology;;   HARLEY DILATION  06/27/2015   Procedure: HARLEY DILATION;  Surgeon: Lamar ONEIDA Holmes, MD;  Location: [facility] ENDOSCOPY;  Service: Endoscopy;;   TONSILLECTOMY     Patient Active Problem List   Diagnosis Date Noted   Stage 3b chronic kidney disease (HCC) 01/27/2022   Cervical disc disorder with radiculopathy 09/03/2021   Pain in right hand 05/13/2020   Acquired hypothyroidism 06/25/2017   B12 deficiency 06/25/2017   Benign essential hypertension 12/14/2016   Low serum vitamin D 12/14/2016   Medicare annual wellness visit, initial 12/14/2016   Hyperlipidemia, mixed 02/03/2016   Hemispheric carotid artery syndrome 02/03/2016   PVC (premature ventricular  contraction) 06/01/2014   Lumbar disc disease 06/01/2014   GERD (gastroesophageal reflux disease) 06/01/2014    ONSET DATE: 06/15/2024  REFERRING DIAG: nontraumatic subluxation of extensor tendon at metacarpophalangeal joint of the left hand.  THERAPY DIAG:  Pain in joint of left hand  Muscle weakness (generalized)  Stiffness of left hand, not elsewhere classified  Rationale for Evaluation and Treatment: Rehabilitation  SUBJECTIVE:   SUBJECTIVE STATEMENT: My pain is better - not anymore that burning pain - I am doing okay with the splints at night time -and I am paying attention how I hold my book and phone- driving and holding steering wheel - got lighter pocket book Pt accompanied by: self  PERTINENT HISTORY: Per last MD note: RAIMA GEATHERS is an 83 year old female who presents with hand deformities and pain, suspected to be related to rheumatoid arthritis. She has experienced finger deformities for a couple of years, with recent onset of pain in her hands. She describes a tearing sensation in her middle finger when lifting or picking up objects, causing significant discomfort throughout the day. Both her ring and middle fingers sublux, and she experiences tenderness in her hands. She reports that her fingers may pop, but do not lock in place.She previously underwent surgery on one hand for a similar issue in [place], though details of the procedure are unavailable. The condition has worsened significantly in the last month, affecting her ability to perform activities such as cutting and chopping vegetables. Despite this, she has managed to complete tasks like preparing beans and corn for storage.  She reports consistent swelling in her middle finger, describing it as fluid-filled, similar to a ganglion. No issues with her wrist or elbow are noted. 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 This condition has persisted for several years, with a recent exacerbation. Clinical presentation and x-ray findings, including erosive changes and ulnar deviation, suggest rheumatoid arthritis. Blood work will confirm the diagnosis. Kidney dysfunction complicates treatment, limiting anti-inflammatory medication use. Order blood work to test for rheumatoid arthritis. Refer to hand therapy for splinting and occupational therapy. Schedule a hand specialist consultation next month. Consider a rheumatology referral based on lab results.  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  PRECAUTIONS: None  RED FLAGS: Pt  recently with dehydration, arthritic changes in hands    WEIGHT BEARING RESTRICTIONS: No  PAIN:  Are you having pain? No pain  FALLS: Has patient fallen in last 6 months? Yes. Number of falls 2, the other day, dizziness with dehydration and also 1 fall in the tub  LIVING ENVIRONMENT: Lives with: lives with their spouse Lives in: Mobile home Stairs: No Has following equipment at home: Single point cane, Environmental consultant - 2 wheeled, shower chair, and Grab bars  PLOF: Independent  PATIENT GOALS: Pt would like to be able to use bilateral hands for daily tasks, would like to stop dropping items and be able to can and put up her vegetables that come in season.    NEXT MD VISIT: unsure of date  OBJECTIVE:  Note: Objective measures were completed at Evaluation unless otherwise noted.  HAND DOMINANCE: Right  ADLs: Overall ADLs: Independent with self care tasks, husband helps with sweeping, dishes and heavier household chores. Pt reports she loves to cook which is a big part of her day.  She has a garden in which she grows a variety of vegetables.  She recently worked to put up [employer] and had difficulty with  cutting/chopping and freezing it.  She also needs to be able to put up green beans which involves snapping which she can no longer perform.  She cut the last batch.  She reports switching from glass bowls which are hard to manage to plastic ones.  She is able to drive but reports increased pain 5/10 with grippng the steering wheel.  She has difficulty with donning shoes and tying so she wears slip on shoes.  She typically performs a sponge bath lately.  Pt also wears thin silicone non slip gloves to be able to hold items.     FUNCTIONAL OUTCOME MEASURES: TBA  UPPER EXTREMITY ROM:     Active ROM Right eval Left eval  Shoulder flexion 128 125  Shoulder abduction 150 150  Shoulder adduction    Shoulder extension    Shoulder internal rotation    Shoulder external rotation    Elbow flexion 130 130  Elbow extension 0 0  Wrist flexion 75 80  Wrist extension 70 55  Wrist ulnar deviation 40 40  Wrist radial deviation 6 4  Wrist pronation Full  full  Wrist supination Full  Full   (Blank rows = not tested)  Active ROM Right eval Left eval  Thumb MCP (0-60) 55 40  Thumb IP (0-80) 80 75  Thumb Radial abd/add (0-55)     Thumb Palmar abd/add (0-45)     Thumb Opposition to Small Finger intact  intact  Index MCP (0-90)     Index PIP (0-100)     Index DIP (0-70)      Long MCP (0-90)      Long PIP (0-100)      Long DIP (0-70)      Ring MCP (0-90)      Ring PIP (0-100)      Ring DIP (0-70)      Little MCP (0-90)      Little PIP (0-100)      Little DIP (0-70)      (Blank rows = not tested)  Full fisting bilaterally, ulnar drift   UPPER EXTREMITY MMT:     MMT Right eval Left eval  Shoulder flexion    Shoulder abduction    Shoulder adduction    Shoulder extension    Shoulder internal rotation  Shoulder external rotation    Middle trapezius    Lower trapezius    Elbow flexion    Elbow extension    Wrist flexion    Wrist extension    Wrist ulnar deviation    Wrist radial  deviation    Wrist pronation    Wrist supination    (Blank rows = not tested)  HAND FUNCTION: Grip strength: Right: 22 lbs; Left: 27 lbs, Lateral pinch: Right: 6 lbs, Left: 6 lbs, and 3 point pinch: Right: 4 lbs, Left: 4 lbs 08/21/24 Grip strength: Right: 30 lbs; Left: 30 lbs, Lateral pinch: Right: 6 lbs, Left: 6 lbs, and 3 point pinch: Right: 5 lbs, Left: 4 lbs COORDINATION: Impaired speed and coordination of movements.  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  She reports popping at times in the left MF but denies any locking.  Pt with a cyst on the MP of Left MF. No tenderness noted at A1 pulley today.  No pain reported at rest but has 5/10 pain with driving today.    TREATMENT DATE: 08/21/24                                                                                                                           Patient arrived with no reports of pain since last seen Patient report modifying some of her activities like a lighter pocketbook, holding her steering wheel, holding her broken phone. She did get a spring-loaded scissors.  Wanted OT to show her how to use it Report handwriting still an issue. Recommend for her [employer] -that she can get on [employer].  Had her practice with it as well as provided her for information Patient also paying attention how she grips and hold the grocery cart as well as POTS pain and sore cylinder objects  Patient since last visit was wearing antiulnar deviation splints at nighttime to maintain fingers alignment as well as avoid composite flexion-tolerating well Patient with less pain  Grip and prehension strength assessed.  Patient showed great progress in grip 30 pounds each Right 3-point pinch improved to 5 pounds Did provide patient with radial deviation of digits as exercise Reviewed with patient as well as  provided handout   PATIENT EDUCATION: Education details: use of contrast for edema management,  Person educated: Patient Education method: Medical illustrator Education comprehension: verbalized understanding  HOME EXERCISE PROGRAM: Will provide a more comprehensive program next session.    GOALS: Goals reviewed with patient? Yes  SHORT TERM GOALS: Target date: 07/21/2024   Pt will demonstrate understanding of use of contrast for edema control in bilateral UEs.   Baseline: no current knowledge Goal status: Met  LONG TERM GOALS: Target date: 08/11/2024  Pt to demonstrate understanding and implementation of joint protection principles into daily routine Baseline: limited knowledge at eval. Goal status: Met  2.  Pt will demonstrate improved ROM of left thumb by 10 degrees to hold objects with secure grip with minimal dropping of items. Baseline: decreased ROM at left thumb in IP and MP and drops items frequently. Goal status: Met  3.  Pt will demonstrate decreased pain 2/10 with modifications to driving. Baseline: increased pain with driving 4/89 with gripping steering wheel.   Goal status: Met  4.  Pt will demonstrate donning and doffing of splints with modified independence.   Baseline: no splints currently Goal status: Met  5.  Pt will demonstrate knowledge and implement strategies to assist with canning and freezing of garden vegetables following joint protection principles.  Baseline: limited knowledge of joint protection principles. Goal status: Partially met   ASSESSMENT:  CLINICAL IMPRESSION: Patient is a 83 y.o. 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 Pt has a history of similar issues with the right hand in the past with surgical intervention.  Patient was seen for 3 visits including evaluation.  Patient made great progress in pain.  Denies any  pain.  Patient does very well with wearing some light copper gloves during the day and at nighttime antiulnar deviation splints to avoid composite flexion in correct alignment of digits.  Patient's grip strength in bilateral hands improved to 30 pounds.  Patient also implemented a lot of modification and joint protection as well as adaptive equipment to make her more independent in ADLs and IADLs.  Patient has a follow-up with Dr. Tobie and Dr. Ezra in early November.  Recommend for patient to discuss with Dr. Ezra?  Surgical intervention for the left 2nd and 3rd. SABRA    PERFORMANCE DEFICITS: in functional skills including ADLs, IADLs, coordination, dexterity, edema, ROM, strength, pain, flexibility, and UE functional use, and psychosocial skills including environmental adaptation, habits, and routines and behaviors.   IMPAIRMENTS: are limiting patient from ADLs, IADLs, and leisure.   COMORBIDITIES: may have co-morbidities  that affects occupational performance. Patient will benefit from skilled OT to address above impairments and improve overall function.  MODIFICATION OR ASSISTANCE TO COMPLETE EVALUATION: Min-Moderate modification of tasks or assist with assess necessary to complete an evaluation.  OT OCCUPATIONAL PROFILE AND HISTORY: Detailed assessment: Review of records and additional review of physical, cognitive, psychosocial history related to current functional performance.  CLINICAL DECISION MAKING: Moderate - several treatment options, min-mod task modification necessary  REHAB POTENTIAL: Good  EVALUATION COMPLEXITY: Moderate      PLAN:  OT FREQUENCY: 1-2x/week  OT DURATION: 6 weeks  PLANNED INTERVENTIONS: 97168 OT Re-evaluation, 97535 self care/ADL training, 02889 therapeutic exercise, 97530 therapeutic activity, 97112 neuromuscular re-education, 97140 manual therapy, 97035 ultrasound, 97018 paraffin, 02960 fluidotherapy, 97010 moist heat, 97010 cryotherapy, 97034 contrast  bath, 97033 iontophoresis, 97760 Orthotic Initial, 97763 Orthotic/Prosthetic subsequent, patient/family education, and DME and/or AE instructions  RECOMMENDED OTHER SERVICES: none  CONSULTED AND AGREED WITH PLAN OF CARE: Patient  PLAN FOR NEXT SESSION: assess for splints next session  Ancel Peters, OT R/L,CLT 08/21/2024, 3:16 PM

## 2024-09-02 ENCOUNTER — Other Ambulatory Visit: Payer: Self-pay

## 2024-09-02 ENCOUNTER — Emergency Department (HOSPITAL_COMMUNITY)

## 2024-09-02 ENCOUNTER — Encounter (HOSPITAL_COMMUNITY): Payer: Self-pay

## 2024-09-02 ENCOUNTER — Emergency Department (HOSPITAL_COMMUNITY)
Admission: EM | Admit: 2024-09-02 | Discharge: 2024-09-02 | Disposition: A | Discharge status: Home / Self Care | Attending: Emergency Medicine | Admitting: Emergency Medicine

## 2024-09-02 DIAGNOSIS — W08XXXA Fall from other furniture, initial encounter: Secondary | ICD-10-CM | POA: Insufficient documentation

## 2024-09-02 DIAGNOSIS — S42291A Other displaced fracture of upper end of right humerus, initial encounter for closed fracture: Secondary | ICD-10-CM | POA: Diagnosis not present

## 2024-09-02 DIAGNOSIS — Z7982 Long term (current) use of aspirin: Secondary | ICD-10-CM | POA: Insufficient documentation

## 2024-09-02 DIAGNOSIS — S4991XA Unspecified injury of right shoulder and upper arm, initial encounter: Secondary | ICD-10-CM | POA: Diagnosis present

## 2024-09-02 DIAGNOSIS — Z91010 Allergy to peanuts: Secondary | ICD-10-CM | POA: Insufficient documentation

## 2024-09-02 DIAGNOSIS — Z23 Encounter for immunization: Secondary | ICD-10-CM | POA: Insufficient documentation

## 2024-09-02 DIAGNOSIS — S81811A Laceration without foreign body, right lower leg, initial encounter: Secondary | ICD-10-CM | POA: Insufficient documentation

## 2024-09-02 LAB — CBC WITH DIFFERENTIAL/PLATELET
Abs Immature Granulocytes: 0.27 K/uL — ABNORMAL HIGH (ref 0.00–0.07)
Basophils Absolute: 0.1 K/uL (ref 0.0–0.1)
Basophils Relative: 1 %
Eosinophils Absolute: 0.1 K/uL (ref 0.0–0.5)
Eosinophils Relative: 1 %
HCT: 41.6 % (ref 36.0–46.0)
Hemoglobin: 13.8 g/dL (ref 12.0–15.0)
Immature Granulocytes: 3 %
Lymphocytes Relative: 12 %
Lymphs Abs: 1.1 K/uL (ref 0.7–4.0)
MCH: 33.3 pg (ref 26.0–34.0)
MCHC: 33.2 g/dL (ref 30.0–36.0)
MCV: 100.2 fL — ABNORMAL HIGH (ref 80.0–100.0)
Monocytes Absolute: 0.4 K/uL (ref 0.1–1.0)
Monocytes Relative: 4 %
Neutro Abs: 7.3 K/uL (ref 1.7–7.7)
Neutrophils Relative %: 79 %
Platelets: 224 K/uL (ref 150–400)
RBC: 4.15 MIL/uL (ref 3.87–5.11)
RDW: 13.5 % (ref 11.5–15.5)
WBC: 9.2 K/uL (ref 4.0–10.5)
nRBC: 0 % (ref 0.0–0.2)

## 2024-09-02 LAB — COMPREHENSIVE METABOLIC PANEL WITH GFR
ALT: 19 U/L (ref 0–44)
AST: 19 U/L (ref 15–41)
Albumin: 3.6 g/dL (ref 3.5–5.0)
Alkaline Phosphatase: 40 U/L (ref 38–126)
Anion gap: 10 (ref 5–15)
BUN: 36 mg/dL — ABNORMAL HIGH (ref 8–23)
CO2: 29 mmol/L (ref 22–32)
Calcium: 9.8 mg/dL (ref 8.9–10.3)
Chloride: 99 mmol/L (ref 98–111)
Creatinine, Ser: 1.81 mg/dL — ABNORMAL HIGH (ref 0.44–1.00)
GFR, Estimated: 28 mL/min — ABNORMAL LOW (ref >60)
Glucose, Bld: 100 mg/dL — ABNORMAL HIGH (ref 70–99)
Potassium: 3.9 mmol/L (ref 3.5–5.1)
Sodium: 138 mmol/L (ref 135–145)
Total Bilirubin: 0.6 mg/dL (ref 0.0–1.2)
Total Protein: 5.4 g/dL — ABNORMAL LOW (ref 6.5–8.1)

## 2024-09-02 LAB — TROPONIN T, HIGH SENSITIVITY
Troponin T High Sensitivity: 30 ng/L — ABNORMAL HIGH (ref 0–19)
Troponin T High Sensitivity: 27 ng/L — ABNORMAL HIGH (ref 0–19)

## 2024-09-02 MED ORDER — TETANUS-DIPHTH-ACELL PERTUSSIS 5-2-15.5 LF-MCG/0.5 IM SUSP
0.5000 mL | Freq: Once | INTRAMUSCULAR | Status: AC
  Administered 2024-09-02: 0.5 mL via INTRAMUSCULAR
  Filled 2024-09-02: qty 0.5

## 2024-09-02 MED ORDER — BACITRACIN ZINC 500 UNIT/GM EX OINT
TOPICAL_OINTMENT | Freq: Once | CUTANEOUS | Status: AC
  Administered 2024-09-02: 1 via TOPICAL
  Filled 2024-09-02: qty 0.9

## 2024-09-02 MED ORDER — DOXYCYCLINE HYCLATE 100 MG PO CAPS: 100.0000 mg | ORAL_CAPSULE | Freq: Two times a day (BID) | ORAL | 0 refills | Status: AC

## 2024-09-02 MED ORDER — BACITRACIN ZINC 500 UNIT/GM EX OINT: 1.0000 | TOPICAL_OINTMENT | Freq: Two times a day (BID) | CUTANEOUS | 0 refills | Status: AC

## 2024-09-02 MED ORDER — OXYCODONE HCL 5 MG PO TABS
5.0000 mg | ORAL_TABLET | Freq: Once | ORAL | Status: AC
  Administered 2024-09-02: 5 mg via ORAL
  Filled 2024-09-02: qty 1

## 2024-09-02 NOTE — ED Notes (Signed)
 Pt/family received d/c paperwork at this time. After going over the paperwork any questions, comments, or concerns were answered to the best of this nurse's knowledge. The pt/family verbally acknowledged the teachings/instructions.

## 2024-09-02 NOTE — ED Provider Notes (Signed)
 [facility] AT [facility] Provider Note   CSN: [id number] Arrival date & time: 09/02/24  1347     Patient presents with: [place]   Brittany Phillips is a 83 y.o. female.   Patient is an 83 year old female who presents emergency department after experiencing a fall off of a stepstool just prior to arrival falling onto her right side.  She is currently complaining of pain to her right shoulder, right arm, right leg.  She denies striking her head or injure her neck or back during the fall.  She denies any anticoagulation but does take aspirin  daily.  There was no associated dizziness, lightheadedness or syncope.  She notes that she has been experiencing some chest discomfort this morning which she took to be indigestion.  Patient denies any abdominal pain, shortness of breath at this time.  She notes that she does have a skin tear to her right lower extremity.   Fall       Prior to Admission medications   Medication Sig Start Date End Date Taking? Authorizing Provider  aspirin  EC 81 MG tablet Take 1 tablet (81 mg total) by mouth at bedtime. 09/24/22   Pappayliou, Dorothyann A, DO  Cholecalciferol (VITAMIN D3) 2000 units TABS Take 2,000 Units by mouth daily.    [provider]  clonazePAM (KLONOPIN) 0.25 MG disintegrating tablet Take 0.25 mg by mouth 2 (two) times daily.    [provider]  estradiol (ESTRACE) 1 MG tablet Take 1 mg by mouth daily.    [provider]  gabapentin (NEURONTIN) 100 MG capsule Take 200 mg by mouth 2 (two) times daily.    [provider]  levothyroxine (SYNTHROID) 50 MCG tablet Take 50 mcg by mouth daily before breakfast.    [provider]  Menthol-Methyl Salicylate (MUSCLE RUB) 10-15 % CREA Apply 1 Application topically in the morning and at bedtime.    [provider]  pantoprazole (PROTONIX) 40 MG tablet Take 40 mg by mouth 2 (two) times daily.    [provider]   polyvinyl alcohol (LIQUIFILM TEARS) 1.4 % ophthalmic solution Place 1 drop into both eyes in the morning and at bedtime.    [provider]  propranolol (INDERAL) 40 MG tablet Take 40-80 mg by mouth See admin instructions. Take 80 mg by mouth in the morning and 40 mg at night    [provider]  sucralfate (CARAFATE) 1 g tablet Take 1 g by mouth 3 (three) times daily with meals.    [provider]  triamterene-hydrochlorothiazide (DYAZIDE) 37.5-25 MG capsule Take 2 capsules by mouth daily.    [provider]  vitamin B-12 (CYANOCOBALAMIN) 1000 MCG tablet Take 1,000 mcg by mouth daily.    [provider]    Allergies: Peanut-containing drug products, Ace inhibitors, Cephalexin , Hydrocodone -acetaminophen , Tizanidine, Wild lettuce extract (lactuca virosa), Codeine, and Metoclopramide    Review of Systems  Musculoskeletal:        Pain to right shoulder, right humeral region, right forearm, right knee and tib-fib region  All other systems reviewed and are negative.   Updated Vital Signs BP 114/72 (BP Location: Left Arm)   Pulse (!) 58   Temp 98.1 F (36.7 C) (Oral)   Resp 16   Ht 5' 2 (1.575 m)   Wt 54.4 kg   SpO2 100%   BMI 21.95 kg/m   Physical Exam Vitals and nursing note reviewed.  Constitutional:      General: She is  not in acute distress.    Appearance: Normal appearance. She is not ill-appearing.  HENT:     Head: Normocephalic and atraumatic.     Nose: Nose normal.     Mouth/Throat:     Mouth: Mucous membranes are moist.  Eyes:     Extraocular Movements: Extraocular movements intact.     Conjunctiva/sclera: Conjunctivae normal.     Pupils: Pupils are equal, round, and reactive to light.  Cardiovascular:     Rate and Rhythm: Normal rate and regular rhythm.     Pulses: Normal pulses.     Heart sounds: Normal heart sounds. No murmur heard.    No gallop.  Pulmonary:     Effort: Pulmonary effort is normal. No respiratory  distress.     Breath sounds: Normal breath sounds. No stridor. No wheezing, rhonchi or rales.  Chest:     Chest wall: No tenderness.  Abdominal:     General: Abdomen is flat. Bowel sounds are normal. There is no distension.     Palpations: Abdomen is soft.     Tenderness: There is no abdominal tenderness. There is no guarding.  Musculoskeletal:        General: Normal range of motion.     Cervical back: Normal range of motion and neck supple. No rigidity or tenderness.     Right lower leg: No edema.     Left lower leg: No edema.     Comments: Tender to palpation noted over right hip, right knee, right tib-fib region, right shoulder, right humeral region, right forearm, nontender palpation of the left upper lower extremity, pelvis stable to AP and lateral compression, peripheral pulses 2+ upper and lower extremities, full range of motion noted throughout, no obvious deformity, ecchymosis noted over right lower extremity and right upper extremity, nontender palpation of the thoracic or lumbar spine, no step-off or deformity  Skin:    General: Skin is warm and dry.     Comments: Large skin tear measuring approximately 10 cm over the anterior aspect of the right tib-fib region, no obvious foreign bodies noted within the wound, wound evaluated to depth with adequate lighting  Neurological:     General: No focal deficit present.     Mental Status: She is alert and oriented to person, place, and time. Mental status is at baseline.     Cranial Nerves: No cranial nerve deficit.     Sensory: No sensory deficit.     Motor: No weakness.     Coordination: Coordination normal.     Gait: Gait normal.  Psychiatric:        Mood and Affect: Mood normal.        Behavior: Behavior normal.        Thought Content: Thought content normal.        Judgment: Judgment normal.     (all labs ordered are listed, but only abnormal results are displayed) Labs Reviewed  COMPREHENSIVE METABOLIC PANEL WITH GFR  CBC  WITH DIFFERENTIAL/PLATELET  TROPONIN T, HIGH SENSITIVITY    EKG: None  Radiology: No results found.   Procedures   Medications Ordered in the ED  Tdap (ADACEL) injection 0.5 mL (has no administration in time range)  oxyCODONE  (Oxy IR/ROXICODONE ) immediate release tablet 5 mg (has no administration in time range)  Medical Decision Making Amount and/or Complexity of Data Reviewed Labs: ordered. Radiology: ordered.  Risk OTC drugs. Prescription drug management.   This patient presents to the ED for concern of fall, right shoulder, right upper extremity pain, right lower extremity pain differential diagnosis includes long bone or joint fracture, rib fracture, intra-abdominal hemorrhage, intracranial hemorrhage, vertebral fracture    Additional history obtained:  Additional history obtained from family External records from outside source obtained and reviewed including medical records   Lab Tests:  I Ordered, and personally interpreted labs.  The pertinent results include: No leukocytosis, no anemia, creatinine at baseline, normal electrolytes, normal kidney function, stable serial troponins   Imaging Studies ordered:  I ordered imaging studies including x-ray of right shoulder, humerus, forearm, tib-fib, knee, chest x-ray, right hip I independently visualized and interpreted imaging which showed right humeral head fracture, no other long bone or joint fractures, no acute cardiopulmonary process I agree with the radiologist interpretation   Medicines ordered and prescription drug management:  I ordered medication including oxycodone , Tdap for skin tear, fracture Reevaluation of the patient after these medicines showed that the patient improved I have reviewed the patients home medicines and have made adjustments as needed   Problem List / ED Course:  Patient is doing very well at this time and is stable for discharge home.   Discussed with patient that she does have a fracture noted to the right humeral head.  She was placed in a sling and was struck to follow-up closely with orthopedics on an outpatient basis.  She notes that she already has an orthopedic specialist that she sees and will reach out to them.  She did have a large skin tear to her right lower extremity which was repaired with Steri-Strips.  Area was thoroughly cleansed prior to procedure.  Low suspicion for ACS at this time.  EKG has no acute ischemic changes and she has stable serial troponins.  Did compare her creatinine to her outpatient labs and this is stable at this time.  Patient did not strike her head during the fall and she was nontender palpation over cervical, thoracic, lumbar spine.  She was nontender to palpation of her chest wall and abdomen.  She had no tenderness over left upper and lower extremity.  She had no concerning neurological deficits.  The need for close follow-up with orthopedics was discussed as well as strict turn precautions for any new or worsening symptoms.  Patient voiced understanding and had no additional questions.   Social Determinants of Health:  None        Final diagnoses:  None    ED Discharge Orders     None          Daralene Lonni JONETTA DEVONNA 09/02/24 1859    Cleotilde Rogue, MD 09/04/24 [phone]

## 2024-09-02 NOTE — Discharge Instructions (Addendum)
 Please follow-up closely with your orthopedic provider on an outpatient basis.  Please continue good wound care of the skin tear as discussed at the bedside.  Return to emergency department immediately for any new or worsening symptoms or for any signs of infection are noted in your leg.

## 2024-09-02 NOTE — ED Triage Notes (Signed)
 Pt from home complains of fall, states that she fell on Right side. Endorses pain in right arm/shoulder, and leg. Pt states that the fall tore skin from knee area to ankle(not visualized at this time do to clothing). Endorses CP but states that it could be indigestion. Pt fell while stepping back off of a stool. Denies head injury. No visible deformities.
